# Patient Record
Sex: Female | Born: 1960 | Race: White | Hispanic: No | Marital: Single | Smoking: Current every day smoker
Health system: Southern US, Community
[De-identification: ages and names within clinical notes are randomized; demographics above are authoritative.]

## PROBLEM LIST (undated history)

## (undated) DIAGNOSIS — R51 Headache: Secondary | ICD-10-CM

## (undated) DIAGNOSIS — I10 Essential (primary) hypertension: Secondary | ICD-10-CM

## (undated) DIAGNOSIS — T8859XA Other complications of anesthesia, initial encounter: Secondary | ICD-10-CM

## (undated) DIAGNOSIS — G8929 Other chronic pain: Secondary | ICD-10-CM

## (undated) DIAGNOSIS — Z9889 Other specified postprocedural states: Secondary | ICD-10-CM

## (undated) DIAGNOSIS — F32A Depression, unspecified: Secondary | ICD-10-CM

## (undated) DIAGNOSIS — E785 Hyperlipidemia, unspecified: Secondary | ICD-10-CM

## (undated) DIAGNOSIS — IMO0002 Reserved for concepts with insufficient information to code with codable children: Secondary | ICD-10-CM

## (undated) DIAGNOSIS — R0600 Dyspnea, unspecified: Secondary | ICD-10-CM

## (undated) DIAGNOSIS — F419 Anxiety disorder, unspecified: Secondary | ICD-10-CM

## (undated) DIAGNOSIS — R519 Headache, unspecified: Secondary | ICD-10-CM

## (undated) HISTORY — DX: Headache: R51

## (undated) HISTORY — PX: TENDON RELEASE: SHX230

## (undated) HISTORY — PX: TONSILLECTOMY: SUR1361

## (undated) HISTORY — PX: OTHER SURGICAL HISTORY: SHX169

## (undated) HISTORY — DX: Other chronic pain: G89.29

## (undated) HISTORY — DX: Reserved for concepts with insufficient information to code with codable children: IMO0002

## (undated) HISTORY — DX: Headache, unspecified: R51.9

## (undated) HISTORY — PX: KNEE ARTHROSCOPY: SUR90

## (undated) HISTORY — DX: Hyperlipidemia, unspecified: E78.5

## (undated) HISTORY — PX: SHOULDER ARTHROTOMY: SHX1050

---

## 1998-12-21 ENCOUNTER — Ambulatory Visit (HOSPITAL_COMMUNITY): Admission: RE | Admit: 1998-12-21 | Discharge: 1998-12-21 | Payer: Self-pay | Admitting: Obstetrics & Gynecology

## 1998-12-21 ENCOUNTER — Encounter: Payer: Self-pay | Admitting: Obstetrics & Gynecology

## 1999-04-29 ENCOUNTER — Encounter: Payer: Self-pay | Admitting: Obstetrics & Gynecology

## 1999-04-29 ENCOUNTER — Inpatient Hospital Stay (HOSPITAL_COMMUNITY): Admission: AD | Admit: 1999-04-29 | Discharge: 1999-04-29 | Payer: Self-pay | Admitting: Obstetrics & Gynecology

## 1999-05-03 ENCOUNTER — Inpatient Hospital Stay (HOSPITAL_COMMUNITY): Admission: AD | Admit: 1999-05-03 | Discharge: 1999-05-06 | Payer: Self-pay | Admitting: Obstetrics & Gynecology

## 1999-05-05 ENCOUNTER — Encounter: Payer: Self-pay | Admitting: Obstetrics & Gynecology

## 2000-01-02 ENCOUNTER — Encounter (INDEPENDENT_AMBULATORY_CARE_PROVIDER_SITE_OTHER): Payer: Self-pay | Admitting: Specialist

## 2000-01-02 ENCOUNTER — Ambulatory Visit (HOSPITAL_BASED_OUTPATIENT_CLINIC_OR_DEPARTMENT_OTHER): Admission: RE | Admit: 2000-01-02 | Discharge: 2000-01-02 | Payer: Self-pay | Admitting: Orthopedic Surgery

## 2000-02-27 ENCOUNTER — Ambulatory Visit (HOSPITAL_COMMUNITY): Admission: RE | Admit: 2000-02-27 | Discharge: 2000-02-27 | Payer: Self-pay

## 2000-04-26 ENCOUNTER — Other Ambulatory Visit: Admission: RE | Admit: 2000-04-26 | Discharge: 2000-04-26 | Payer: Self-pay | Admitting: Obstetrics & Gynecology

## 2000-07-16 ENCOUNTER — Emergency Department (HOSPITAL_COMMUNITY): Admission: EM | Admit: 2000-07-16 | Discharge: 2000-07-17 | Payer: Self-pay | Admitting: Emergency Medicine

## 2000-10-22 ENCOUNTER — Inpatient Hospital Stay (HOSPITAL_COMMUNITY): Admission: AD | Admit: 2000-10-22 | Discharge: 2000-10-25 | Payer: Self-pay | Admitting: Obstetrics & Gynecology

## 2000-11-22 ENCOUNTER — Other Ambulatory Visit: Admission: RE | Admit: 2000-11-22 | Discharge: 2000-11-22 | Payer: Self-pay | Admitting: Obstetrics & Gynecology

## 2002-08-15 ENCOUNTER — Encounter: Payer: Self-pay | Admitting: Obstetrics & Gynecology

## 2002-08-15 ENCOUNTER — Ambulatory Visit (HOSPITAL_COMMUNITY): Admission: RE | Admit: 2002-08-15 | Discharge: 2002-08-15 | Payer: Self-pay | Admitting: Obstetrics & Gynecology

## 2003-02-22 ENCOUNTER — Encounter: Admission: RE | Admit: 2003-02-22 | Discharge: 2003-02-22 | Payer: Self-pay | Admitting: Neurosurgery

## 2003-03-11 ENCOUNTER — Encounter: Admission: RE | Admit: 2003-03-11 | Discharge: 2003-03-11 | Payer: Self-pay | Admitting: Neurosurgery

## 2003-11-18 ENCOUNTER — Other Ambulatory Visit (HOSPITAL_COMMUNITY): Admission: RE | Admit: 2003-11-18 | Discharge: 2004-02-16 | Payer: Self-pay | Admitting: Psychiatry

## 2003-11-18 ENCOUNTER — Inpatient Hospital Stay (HOSPITAL_COMMUNITY): Admission: RE | Admit: 2003-11-18 | Discharge: 2003-11-23 | Payer: Self-pay | Admitting: Psychiatry

## 2003-11-18 ENCOUNTER — Ambulatory Visit: Payer: Self-pay | Admitting: Psychiatry

## 2004-10-19 ENCOUNTER — Encounter: Admission: RE | Admit: 2004-10-19 | Discharge: 2005-01-17 | Payer: Self-pay

## 2004-11-27 ENCOUNTER — Inpatient Hospital Stay (HOSPITAL_COMMUNITY): Admission: EM | Admit: 2004-11-27 | Discharge: 2004-12-04 | Payer: Self-pay | Admitting: Emergency Medicine

## 2004-11-27 ENCOUNTER — Ambulatory Visit: Payer: Self-pay | Admitting: Infectious Diseases

## 2005-01-24 ENCOUNTER — Ambulatory Visit: Payer: Self-pay | Admitting: Internal Medicine

## 2006-01-17 ENCOUNTER — Inpatient Hospital Stay: Payer: Self-pay

## 2006-01-21 ENCOUNTER — Other Ambulatory Visit: Payer: Self-pay

## 2006-01-28 ENCOUNTER — Ambulatory Visit: Payer: Self-pay | Admitting: Unknown Physician Specialty

## 2006-07-24 ENCOUNTER — Encounter
Admission: RE | Admit: 2006-07-24 | Discharge: 2006-10-22 | Payer: Self-pay | Admitting: Physical Medicine and Rehabilitation

## 2006-07-24 ENCOUNTER — Ambulatory Visit: Payer: Self-pay | Admitting: Physical Medicine and Rehabilitation

## 2007-09-02 ENCOUNTER — Emergency Department (HOSPITAL_COMMUNITY): Admission: EM | Admit: 2007-09-02 | Discharge: 2007-09-03 | Payer: Self-pay | Admitting: Emergency Medicine

## 2008-11-21 ENCOUNTER — Emergency Department (HOSPITAL_COMMUNITY): Admission: EM | Admit: 2008-11-21 | Discharge: 2008-11-21 | Payer: Self-pay | Admitting: Emergency Medicine

## 2008-11-26 ENCOUNTER — Emergency Department (HOSPITAL_COMMUNITY): Admission: EM | Admit: 2008-11-26 | Discharge: 2008-11-26 | Payer: Self-pay | Admitting: Family Medicine

## 2009-01-03 ENCOUNTER — Encounter: Admission: RE | Admit: 2009-01-03 | Discharge: 2009-01-03 | Payer: Self-pay | Admitting: General Practice

## 2009-07-24 ENCOUNTER — Emergency Department (HOSPITAL_COMMUNITY): Admission: EM | Admit: 2009-07-24 | Discharge: 2009-07-24 | Payer: Self-pay | Admitting: Family Medicine

## 2009-07-24 ENCOUNTER — Emergency Department (HOSPITAL_COMMUNITY): Admission: EM | Admit: 2009-07-24 | Discharge: 2009-07-25 | Payer: Self-pay | Admitting: Emergency Medicine

## 2009-08-17 HISTORY — PX: CHOLECYSTECTOMY: SHX55

## 2009-09-12 ENCOUNTER — Inpatient Hospital Stay (HOSPITAL_COMMUNITY): Admission: EM | Admit: 2009-09-12 | Discharge: 2009-09-15 | Payer: Self-pay | Admitting: Emergency Medicine

## 2009-09-13 ENCOUNTER — Encounter (INDEPENDENT_AMBULATORY_CARE_PROVIDER_SITE_OTHER): Payer: Self-pay

## 2009-10-31 ENCOUNTER — Ambulatory Visit: Payer: Self-pay | Admitting: Vascular Surgery

## 2009-10-31 ENCOUNTER — Encounter (INDEPENDENT_AMBULATORY_CARE_PROVIDER_SITE_OTHER): Payer: Self-pay | Admitting: Emergency Medicine

## 2009-10-31 ENCOUNTER — Emergency Department (HOSPITAL_COMMUNITY): Admission: EM | Admit: 2009-10-31 | Discharge: 2009-10-31 | Payer: Self-pay | Admitting: Emergency Medicine

## 2009-11-14 ENCOUNTER — Ambulatory Visit: Payer: Self-pay | Admitting: Family Medicine

## 2009-11-14 DIAGNOSIS — L600 Ingrowing nail: Secondary | ICD-10-CM | POA: Insufficient documentation

## 2009-11-14 DIAGNOSIS — IMO0001 Reserved for inherently not codable concepts without codable children: Secondary | ICD-10-CM | POA: Insufficient documentation

## 2009-11-14 DIAGNOSIS — M25569 Pain in unspecified knee: Secondary | ICD-10-CM | POA: Insufficient documentation

## 2009-12-08 ENCOUNTER — Emergency Department (HOSPITAL_COMMUNITY): Admission: EM | Admit: 2009-12-08 | Discharge: 2009-12-08 | Payer: Self-pay | Admitting: Family Medicine

## 2010-03-28 ENCOUNTER — Emergency Department (HOSPITAL_COMMUNITY)
Admission: EM | Admit: 2010-03-28 | Discharge: 2010-03-28 | Payer: Self-pay | Source: Home / Self Care | Admitting: Emergency Medicine

## 2010-04-03 LAB — CBC
HCT: 39.4 % (ref 36.0–46.0)
Hemoglobin: 13.2 g/dL (ref 12.0–15.0)
MCH: 32.2 pg (ref 26.0–34.0)
MCHC: 33.5 g/dL (ref 30.0–36.0)
MCV: 96.1 fL (ref 78.0–100.0)
Platelets: 207 10*3/uL (ref 150–400)
RBC: 4.1 MIL/uL (ref 3.87–5.11)
RDW: 14.5 % (ref 11.5–15.5)
WBC: 8.9 10*3/uL (ref 4.0–10.5)

## 2010-04-03 LAB — COMPREHENSIVE METABOLIC PANEL
ALT: 34 U/L (ref 0–35)
AST: 55 U/L — ABNORMAL HIGH (ref 0–37)
Albumin: 3.6 g/dL (ref 3.5–5.2)
Alkaline Phosphatase: 71 U/L (ref 39–117)
BUN: 8 mg/dL (ref 6–23)
CO2: 25 mEq/L (ref 19–32)
Calcium: 8.9 mg/dL (ref 8.4–10.5)
Chloride: 106 mEq/L (ref 96–112)
Creatinine, Ser: 0.72 mg/dL (ref 0.4–1.2)
GFR calc Af Amer: >60 mL/min (ref >60)
GFR calc non Af Amer: >60 mL/min (ref >60)
Glucose, Bld: 90 mg/dL (ref 70–99)
Potassium: 3.5 mEq/L (ref 3.5–5.1)
Sodium: 141 mEq/L (ref 135–145)
Total Bilirubin: 0.4 mg/dL (ref 0.3–1.2)
Total Protein: 6.1 g/dL (ref 6.0–8.3)

## 2010-04-03 LAB — DIFFERENTIAL
Basophils Absolute: 0 10*3/uL (ref 0.0–0.1)
Basophils Relative: 0 % (ref 0–1)
Eosinophils Absolute: 0.1 10*3/uL (ref 0.0–0.7)
Eosinophils Relative: 1 % (ref 0–5)
Lymphocytes Relative: 20 % (ref 12–46)
Lymphs Abs: 1.8 10*3/uL (ref 0.7–4.0)
Monocytes Absolute: 0.9 10*3/uL (ref 0.1–1.0)
Monocytes Relative: 10 % (ref 3–12)
Neutro Abs: 6.2 10*3/uL (ref 1.7–7.7)
Neutrophils Relative %: 69 % (ref 43–77)

## 2010-04-03 LAB — LIPASE, BLOOD: Lipase: 27 U/L (ref 11–59)

## 2010-04-03 LAB — URINALYSIS, ROUTINE W REFLEX MICROSCOPIC
Hgb urine dipstick: NEGATIVE
Ketones, ur: NEGATIVE mg/dL
Nitrite: NEGATIVE
Protein, ur: NEGATIVE mg/dL
Specific Gravity, Urine: 1.008 (ref 1.005–1.030)
Urine Glucose, Fasting: NEGATIVE mg/dL
Urobilinogen, UA: 1 mg/dL (ref 0.0–1.0)
pH: 6.5 (ref 5.0–8.0)

## 2010-04-03 LAB — POCT PREGNANCY, URINE: Preg Test, Ur: NEGATIVE

## 2010-04-08 ENCOUNTER — Emergency Department (HOSPITAL_COMMUNITY)
Admission: EM | Admit: 2010-04-08 | Discharge: 2010-04-08 | Payer: Self-pay | Source: Home / Self Care | Admitting: Family Medicine

## 2010-04-18 NOTE — Assessment & Plan Note (Signed)
Summary: NP,KNEE PAIN,TOE ISSUES,NECK PAIN X 2 WKS   Vital Signs:  Patient profile:   50 year old female Height:      61 inches Weight:      130 pounds BMI:     24.65 BP sitting:   142 / 84  Vitals Entered By: Lillia Pauls CMA (November 14, 2009 1:58 PM)  History of Present Illness: 1) Right great toe pain--last few weeks. Previous hx of ingrown toe nail. No redness or d/c. Has had prior partial nail removal (without ablation).   2) B knee pain after running hills. Aching pain--lasts several hours. Also some pain with extended sitting. No prior knee injury or surgery. Reports she runs 5 miles in am and 3 miles in pm many days--trails mostly. She also bikes. No locking or giving way. No swelling redness or warmth.  3) Upper back / neck pain----many years. Has been on chronic narcotics in past, then went through outpt detox / rehab. 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 About a week ago she fell in her home--no lOC--has had increased occasional sharp pains radiating to her upppe back R>L since. Occasionally will have sharp pain in her  right arm. Occasional stinging or tingling--no true numbness or weakness--ptingling lasts a few seconds. No change iin dexterity--not dropping anything  PERTINENT PMH/PSH: Neck surgery as above cholecystectomy prior narcotics addiction--was in pain cliinic mgmt then -outpatient rehab. sees psychiatrist for depression and mood issues.  Current Medications (verified): 1)  Naprosyn 250 Mg Tabs (Naproxen) .Marland Kitchen.. 1 By Mouth Two Times A Day As Needed Joint Pain / Neck Pain 2)  Mirapex 0.5 Mg Tabs (Pramipexole Dihydrochloride) .... Per Psych 3)  Alprazolam 1 Mg Tabs (Alprazolam) .... Per Psych 4)  Depakote 500 Mg Tbec (Divalproex Sodium) .... Per Psych 5)  Flurazepam Hcl 30 Mg Caps (Flurazepam Hcl) .... Per Psych 6)  Antihistamine .... Per Psych  Allergies (verified): 1)  Sulfa  Review of Systems  The patient denies  fever.         Please see HPI for additional ROS.   Physical Exam  General:  alert, well-developed, well-nourished, and well-hydrated.   Neck:  supple.  FROM in flexion--lacks a little in extension--no pain. Normal lateral rotation. neg ative spurlings test B. Msk:  TTP B trapezius--almost like a trigger point but a larger area--4-5 cm on each side. Top of trap--no muscle spasm or deformity felt.  SHOULDEERS FROM in all planes and intact RC strength. RIGHT GREAT TOE: lateral corner of nail ingrowing--no sign of infection. no erythema or d.c.  sensation intact   Knee Exam  Knee Exam:    Right:    Inspection:  Normal    Palpation:  Normal    Stability:  stable    Tenderness:  no    Swelling:  no    Erythema:  no    Left:    Inspection:  Normal    Palpation:  Normal    Stability:  stable    Tenderness:  no    Swelling:  no    Erythema:  no    Full flexion and exctension B.  NO effusion. Small amount crepitus right. No joint line tenderness. calves B soft. Popliteal space benign   Detailed Back/Spine Exam  Cervical Exam:  Inspection-deformity:    Normal Palpation-spinal tenderness:  Normal  Thoracic Exam:  Inspection-deformity:    Normal Palpation-spinal tenderness:  Normal   Impression & Recommendations:  Problem #  1:  UNSPECIFIED MYALGIA AND MYOSITIS (ICD-729.1)  Her updated medication list for this problem includes:    Naprosyn 250 Mg Tabs (Naproxen) .Marland Kitchen... 1 by mouth two times a day as needed joint pain / neck pain she wanted flexeril but with her history of addiction issues I think NSAID would be better option  Problem # 2:  INGROWN TOENAIL (ICD-703.0) I recommended she get a PCP for these issues or see podiatry  Problem # 3:  PATELLO-FEMORAL SYNDROME (ICD-719.46) NSAIDS, HEP for knee rehab, activity modification  Complete Medication List: 1)  Naprosyn 250 Mg Tabs (Naproxen) .Marland Kitchen.. 1 by mouth two times a day as needed joint pain / neck pain 2)  Mirapex  0.5 Mg Tabs (Pramipexole dihydrochloride) .... Per psych 3)  Alprazolam 1 Mg Tabs (Alprazolam) .... Per psych 4)  Depakote 500 Mg Tbec (Divalproex sodium) .... Per psych 5)  Flurazepam Hcl 30 Mg Caps (Flurazepam hcl) .... Per psych 6)  Antihistamine  .... Per psych Prescriptions: NAPROSYN 250 MG TABS (NAPROXEN) 1 by mouth two times a day as needed joint pain / neck pain  #60 x 0   Entered and Authorized by:   Denny Levy MD   Signed by:   Denny Levy MD on 11/14/2009   Method used:   Print then Give to Patient   RxID:   [id number]

## 2010-06-02 LAB — DIFFERENTIAL
Basophils Absolute: 0 10*3/uL (ref 0.0–0.1)
Basophils Relative: 0 % (ref 0–1)
Eosinophils Absolute: 0.2 10*3/uL (ref 0.0–0.7)
Eosinophils Relative: 4 % (ref 0–5)
Lymphocytes Relative: 38 % (ref 12–46)
Lymphs Abs: 1.9 10*3/uL (ref 0.7–4.0)
Monocytes Absolute: 0.4 10*3/uL (ref 0.1–1.0)
Monocytes Relative: 7 % (ref 3–12)
Neutro Abs: 2.5 10*3/uL (ref 1.7–7.7)
Neutrophils Relative %: 51 % (ref 43–77)

## 2010-06-02 LAB — URINALYSIS, ROUTINE W REFLEX MICROSCOPIC
Glucose, UA: NEGATIVE mg/dL
Hgb urine dipstick: NEGATIVE
Ketones, ur: 15 mg/dL — AB
Nitrite: NEGATIVE
Protein, ur: NEGATIVE mg/dL
Specific Gravity, Urine: 1.03 (ref 1.005–1.030)
Urobilinogen, UA: 2 mg/dL — ABNORMAL HIGH (ref 0.0–1.0)
pH: 6 (ref 5.0–8.0)

## 2010-06-02 LAB — CBC
HCT: 41.2 % (ref 36.0–46.0)
Hemoglobin: 14.3 g/dL (ref 12.0–15.0)
MCH: 30.8 pg (ref 26.0–34.0)
MCHC: 34.7 g/dL (ref 30.0–36.0)
MCV: 88.8 fL (ref 78.0–100.0)
Platelets: 221 10*3/uL (ref 150–400)
RBC: 4.64 MIL/uL (ref 3.87–5.11)
RDW: 12.7 % (ref 11.5–15.5)
WBC: 4.9 10*3/uL (ref 4.0–10.5)

## 2010-06-02 LAB — COMPREHENSIVE METABOLIC PANEL
ALT: 13 U/L (ref 0–35)
AST: 18 U/L (ref 0–37)
Albumin: 3.7 g/dL (ref 3.5–5.2)
Alkaline Phosphatase: 58 U/L (ref 39–117)
BUN: 14 mg/dL (ref 6–23)
CO2: 22 mEq/L (ref 19–32)
Calcium: 9.1 mg/dL (ref 8.4–10.5)
Chloride: 108 mEq/L (ref 96–112)
Creatinine, Ser: 0.79 mg/dL (ref 0.4–1.2)
GFR calc Af Amer: >60 mL/min (ref >60)
GFR calc non Af Amer: >60 mL/min (ref >60)
Glucose, Bld: 88 mg/dL (ref 70–99)
Potassium: 3.4 mEq/L — ABNORMAL LOW (ref 3.5–5.1)
Sodium: 142 mEq/L (ref 135–145)
Total Bilirubin: 0.5 mg/dL (ref 0.3–1.2)
Total Protein: 6.3 g/dL (ref 6.0–8.3)

## 2010-06-02 LAB — LIPASE, BLOOD: Lipase: 27 U/L (ref 11–59)

## 2010-06-04 LAB — URINALYSIS, ROUTINE W REFLEX MICROSCOPIC
Protein, ur: NEGATIVE mg/dL
Specific Gravity, Urine: 1.025 (ref 1.005–1.030)
Urobilinogen, UA: 0.2 mg/dL (ref 0.0–1.0)

## 2010-06-04 LAB — COMPREHENSIVE METABOLIC PANEL
AST: 70 U/L — ABNORMAL HIGH (ref 0–37)
Albumin: 3 g/dL — ABNORMAL LOW (ref 3.5–5.2)
Albumin: 3.5 g/dL (ref 3.5–5.2)
Alkaline Phosphatase: 50 U/L (ref 39–117)
BUN: 13 mg/dL (ref 6–23)
CO2: 24 mEq/L (ref 19–32)
Calcium: 8 mg/dL — ABNORMAL LOW (ref 8.4–10.5)
Chloride: 104 mEq/L (ref 96–112)
Chloride: 104 mEq/L (ref 96–112)
Creatinine, Ser: 0.67 mg/dL (ref 0.4–1.2)
GFR calc Af Amer: >60 mL/min (ref >60)
GFR calc non Af Amer: >60 mL/min (ref >60)
Glucose, Bld: 84 mg/dL (ref 70–99)
Potassium: 4 mEq/L (ref 3.5–5.1)
Total Bilirubin: 0.6 mg/dL (ref 0.3–1.2)

## 2010-06-04 LAB — BASIC METABOLIC PANEL
BUN: <1 mg/dL — ABNORMAL LOW (ref 6–23)
Chloride: 105 mEq/L (ref 96–112)
Glucose, Bld: 109 mg/dL — ABNORMAL HIGH (ref 70–99)
Potassium: 3.5 mEq/L (ref 3.5–5.1)

## 2010-06-04 LAB — CBC
HCT: 42.5 % (ref 36.0–46.0)
Hemoglobin: 14.5 g/dL (ref 12.0–15.0)
MCH: 32.6 pg (ref 26.0–34.0)
MCH: 32.8 pg (ref 26.0–34.0)
MCHC: 34.7 g/dL (ref 30.0–36.0)
MCV: 95.7 fL (ref 78.0–100.0)
Platelets: 185 10*3/uL (ref 150–400)
Platelets: 198 10*3/uL (ref 150–400)
RBC: 4.03 MIL/uL (ref 3.87–5.11)
RBC: 4.44 MIL/uL (ref 3.87–5.11)
WBC: 8.1 10*3/uL (ref 4.0–10.5)

## 2010-06-04 LAB — DIFFERENTIAL
Basophils Absolute: 0 10*3/uL (ref 0.0–0.1)
Basophils Relative: 0 % (ref 0–1)
Monocytes Absolute: 0.5 10*3/uL (ref 0.1–1.0)
Neutro Abs: 5.3 10*3/uL (ref 1.7–7.7)

## 2010-06-04 LAB — RAPID URINE DRUG SCREEN, HOSP PERFORMED
Barbiturates: NOT DETECTED
Opiates: NOT DETECTED

## 2010-06-04 LAB — LIPASE, BLOOD: Lipase: 28 U/L (ref 11–59)

## 2010-06-06 LAB — COMPREHENSIVE METABOLIC PANEL
AST: 38 U/L — ABNORMAL HIGH (ref 0–37)
Albumin: 3.6 g/dL (ref 3.5–5.2)
Chloride: 102 mEq/L (ref 96–112)
Creatinine, Ser: 0.52 mg/dL (ref 0.4–1.2)
GFR calc Af Amer: >60 mL/min (ref >60)
Potassium: 3.4 mEq/L — ABNORMAL LOW (ref 3.5–5.1)
Total Bilirubin: 0.5 mg/dL (ref 0.3–1.2)

## 2010-06-06 LAB — CBC
Platelets: 172 10*3/uL (ref 150–400)
WBC: 7.8 10*3/uL (ref 4.0–10.5)

## 2010-06-06 LAB — RAPID URINE DRUG SCREEN, HOSP PERFORMED
Amphetamines: NOT DETECTED
Benzodiazepines: POSITIVE — AB

## 2010-06-06 LAB — DIFFERENTIAL
Basophils Absolute: 0 10*3/uL (ref 0.0–0.1)
Eosinophils Relative: 1 % (ref 0–5)
Lymphocytes Relative: 17 % (ref 12–46)
Monocytes Absolute: 0.4 10*3/uL (ref 0.1–1.0)

## 2010-06-06 LAB — ETHANOL: Alcohol, Ethyl (B): 76 mg/dL — ABNORMAL HIGH (ref 0–10)

## 2010-06-22 ENCOUNTER — Other Ambulatory Visit: Payer: Self-pay | Admitting: Obstetrics & Gynecology

## 2010-06-22 DIAGNOSIS — N6322 Unspecified lump in the left breast, upper inner quadrant: Secondary | ICD-10-CM

## 2010-06-23 ENCOUNTER — Ambulatory Visit
Admission: RE | Admit: 2010-06-23 | Discharge: 2010-06-23 | Disposition: A | Discharge status: Home / Self Care | Payer: Medicare Other | Source: Ambulatory Visit | Attending: Obstetrics & Gynecology | Admitting: Obstetrics & Gynecology

## 2010-06-23 ENCOUNTER — Other Ambulatory Visit: Payer: Self-pay | Admitting: Obstetrics & Gynecology

## 2010-06-23 DIAGNOSIS — N6322 Unspecified lump in the left breast, upper inner quadrant: Secondary | ICD-10-CM

## 2010-08-04 NOTE — Discharge Summary (Signed)
[facility]  PatientTAYTUM, SCHECK Visit Number: [id number] MRN: [id number]          Service Type: OBS Location: 9300 [id number] Attending Physician:  Genia Del Dictated by:   Genia Del, M.D. Admit Date:  10/22/2000 Discharge Date: 10/25/2000                             Discharge Summary  DATE OF BIRTH:                May 02, 1960.  ADMISSION DIAGNOSES:          1. Thirty-eight plus weeks.                               2. Two previous cesarean sections.                               3. Chronic neck pain.  DISCHARGE DIAGNOSES:          1. Thirty-eight plus weeks.                               2. Two previous cesarean sections.                               3. Chronic neck pain.                               4. Birth of a baby girl by cesarean section                                  on October 22, 2000.  HOSPITAL COURSE:              The patient was admitted on October 22, 2000 and had a repeat low transverse cesarean section on the same day. Birth was at 8:15 a.m. of a baby girl, Apgars 9 and 9. Estimated blood loss was 500 cc. No complication occurred during the surgery.  The postoperative evolution was unremarkable. The patient remained stable and afebrile. Her hemoglobin on postoperative day #1 was 10.9, hematocrit 31.6. The patient was discharged on postoperative day #3 in a good stable status. She was given postoperative advice.  DISCHARGE MEDICATIONS:        Darvocet-N 100, 30 tablets, were prescribed. She was advised to continue her Prenate vitamins and have a high iron diet.  DISCHARGE FOLLOWUP:           The patient will follow up at the office for a postpartum appointment in four weeks. Dictated by:   Genia Del, M.D. Attending Physician:  Genia Del DD:  11/20/00 TD:  11/20/00 Job: [id number]

## 2010-08-04 NOTE — H&P (Signed)
NAME:  [name], [name]                 ACCOUNT NO.:  [id number]   MEDICAL RECORD NO.:  [id number]          PATIENT TYPE:  EMS   LOCATION:  MINO                         FACILITY:  [facility]   PHYSICIAN:  Mobolaji B. Bakare, M.D.DATE OF BIRTH:  Aug 07, 1960   DATE OF ADMISSION:  11/27/2004  DATE OF DISCHARGE:                                HISTORY & PHYSICAL   PRIMARY CARE PHYSICIAN:  At [facility].   PSYCHIATRIST:  Dr. [name].   CHIEF COMPLAINT:  Dizziness and blacking out for the past six days.   HISTORY OF PRESENTING COMPLAINT:  [name] [name] is a 50 year old Caucasian female  with history of major depression and chronic pain involving cervical  degenerative disk disease.  She was seen by psychiatric nurse practitioner a  week ago.  At that visit she complained of insomnia and she was prescribed  100 mg q.h.s.  Subsequently the patient developed dizziness on standing up  from a sitting position and she has been feeling extremely weak, unable to  get out of bed and she was brought to the emergency department for  evaluation today.  She is grossly orthostatic with a blood pressure of 71/58  on laying and 57/42 on standing.  She was given 2 liters of IV normal  saline.  Blood pressure has improved to 98/60.   [name] [name] has a history of major depression and she is still having symptoms  including lack of concentration, loss of interest in activities.  She has  low energy, poor appetite.  She last saw her psychiatrist about six months  ago.  She has been seen by the nurse practitioner in the office Misty Stanley Pous).   REVIEW OF SYSTEMS:  She denies any abdominal pain, dysuria, urgency or  increased frequency of micturition.  She does have cough productive of  sputum.  No chest pain, no headaches.  Her cervical pain has improved to  3/10 which is manageable for her with the chronic pain medications.   PAST MEDICAL HISTORY:  1.  Major depression.  2.  Chronic cervical pain/cervical  degenerative disk disease.  3.  History of pyelonephritis.   PAST SURGICAL HISTORY:  1.  Cervical disk surgery in 1999.  2.  C-section.   CURRENT MEDICATIONS:  1.  Trazodone 100 mg q.h.s.  2.  Vicodin 7.5/325 p.r.n.  3.  Klonopin 0.5 mg b.i.d.  4.  Cozaar 20 mg q. day.  5.  Ambien 25 mg q.h.s.  6.  Kadian 30 mg b.i.d.   ALLERGIES:  1.  Celebrex.  2.  Sulfa.   SOCIAL HISTORY:  The patient is a well educated lady with masters in  business administration and masters in taxation.  She worked as a IT trainer for  fifteen years.  She has been out of work since August, 2005 secondary to  major depression.  She has three children, two grown, one is 49 and 95-years-  old.  She has got a third child, a girl, 46-years-old.  She lives with her  husband.  She smokes half to one pack per day of cigarettes.  She  denies  alcohol use and drug abuse.   FAMILY HISTORY:  Mother has rheumatoid arthritis.  Father has heart disease.  There is no family history of stroke, diabetes or cancer.   PHYSICAL EXAMINATION:  VITAL SIGNS:  Initial vitals:  Temperature was 97.1,  blood pressure 71/38, laying with a heart rate of 59, 65/46, sitting with a  heart rate of 62, 57/42 with a heart rate of 76.  Blood pressure improved to  98/60 after 2 liters of IV fluids.  GENERAL:  On examination the patient's affect is flat.  She sometimes doses  off during conversation.  She is not in respiratory distress.  HEENT:  Normocephalic, atraumatic.  Pupils equal, round, reactive to light.  Extraocular muscles intact.  No carotid bruit, no elevated JVD, no oral  thrush.  Mucous membranes moist.  LUNGS:  Clear clinically to auscultation.  CV:  S1, S2 regular, no murmur, no gallop, no rub.  ABDOMEN:  Not distended, soft, mild right lumbar area tenderness without  rebound or guarding, bowel sounds present, no palpable organomegaly.  EXTREMITIES:  No pedal edema, no calf tenderness, dorsalis pedis pulses 2+  bilaterally.  CNS:   No focal neurological deficits.  SKIN:  No rash, no petechiae.   INITIAL LABORATORY DATA:  White cells 7.6, hemoglobin 14.0, hematocrit 41.5,  MCV 89.8, platelets 190, neutrophils 57, lymphocytes 34%, sodium 139,  potassium 3.7, chloride 105, BUN 12, creatinine 0.6.  Urinalysis is clear  and specific gravity of 1.029, small bilirubin, negative for nitrate,  leukocyte esterase and protein.  EKG shows sinus bradycardia with a heart  rate of 58 and nonspecific ST changes in V3 with an inverted T, flat ST  segment in V4.  She has normal intervals.  Glucose is 65 (low).   ASSESSMENT AND PLAN:  1.  Hypotension.  This is probably secondary to medications with the recent      introduction of Trazodone on a background of multiple narcotics and      analgesics.  Will hold on Trazodone for now, reduce Kadian to 20 mg      b.i.d. as tolerated with her pain, continue Vicodin at 7.5/325 q. 4      hours p.r.n., continue IV fluids normal saline at 150 cc per hour.  She      may need multiple boluses to keep systolic blood pressure greater than      90.  Will reduce Ambien to 10 mg q.h.s.  I doubt this hypotension is      related to sepsis.  Will check blood cultures, cardiac enzymes, repeat      orthostatic blood pressure q. day.  2.  Major depression.  The patient is currently on Prozac.  She still has      symptoms of anhedonia.  Will ask Dr. [name] to see regarding      maximizing treatment.  She may benefit from Cymbalta in view of her      ongoing pain syndrome.  3.  Chronic pain/cervical disk disease.  Will continue pain medications as      outlined above.  4.  Hypoglycemia.  I think this is related to her poor p.o. intake and      secondary to the extreme weakness and      severe depression.  Adrenal insufficiency is a consideration in the      setting of hypotension.  Will check random cortisol, monitor CBG q. 2     hours until stable for consecutive times.  5.  Tobacco use.  Will obtain  tobacco cessation counseling.      Mobolaji B. Corky Downs, M.D.  Electronically Signed     MBB/MEDQ  D:  11/27/2004  T:  11/27/2004  Job:  [id number]   cc:   Daine Floras, M.D.  Fax: [phone]   [facility]

## 2010-08-04 NOTE — Discharge Summary (Signed)
NAMEVIRLEE, [name] [name]                 ACCOUNT NO.:  [id number]   MEDICAL RECORD NO.:  [id number]          PATIENT TYPE:  IPS   LOCATION:  0504                          FACILITY:  [facility]   PHYSICIAN:  Syed T. Arfeen, M.D.   DATE OF BIRTH:  02-23-1961   DATE OF ADMISSION:  11/18/2003  DATE OF DISCHARGE:  11/23/2003                                 DISCHARGE SUMMARY   IDENTIFYING DATA:  The patient is a 50 year old married white female who  came as a voluntary admission.  History of the presenting illness:  The  patient has a history of depression, has been worsening of her symptoms  since January of this year when her pain doctor closed his practice.  The  patient then located a new physician who took her off OxyContin and since  then she has been struggling to adjust to a new medication.  The patient  reported increased depression, with anhedonia and neurovegetative symptoms.  She lost her daily activities and is unable to get up to eat or drink.  She  reported some weight loss, however unable to describe the quantity.  The  patient has a history of degenerative disk disease and had a surgery of C7  with metal plate and screws inserted in 1999.  She is now on pain medicine  but feels that these medicines are not helping her.  She is also on  antidepressant for her depressed mood but had not even gotten it refilled  and finding herself unable to get out of bed to keep her appointment.  The  patient has a 21-year-old child at home and is finding it difficult to do her  daily activities and to attend properly to her 35-year-old.  The patient has  suicidal thoughts but feels that she is not even able to organize how she  might want to kill herself.  She denied any homicidal thoughts, auditory  hallucinations or visual hallucinations.  She denies any panic attacks or  mood fluctuation.   PAST PSYCHIATRIC HISTORY:  This is the patient's first admission to [facility].  The patient does have  a history of postpartum depression  after the birth of her last child and was treated with Risperdal and Prozac.  The patient did report doing quite well on these medications.  The patient  has been hospitalized at least once in [place] complaints of [place]orexia,  but that was 15 years ago.  More recently, the patient was at one point on  Paxil and Zoloft in the past for depression and does not remember how well  she responded because it was too long ago for her to remember, although she  reported that she had been doing well on the Prozac.   PAST MEDICAL HISTORY:  Remarkable to surgery in 1999 for her neck, C7,  history of poly nephritis.   PHYSICAL EXAMINATION:  Well-nourished, well-developed female who appears to  be in no distress.  She is somewhat disheveled.  Gait is within normal  limits.  Neurological roughly intact.  No focal findings observed in  physical  examination.  For details please see admission note.   ALLERGIES:  CORTISONE, SULFA.   LABORATORY DATA:  Normal CBC and WBC.  Hemoglobin 11.6, Hematocrit 34.2.  Basic metabolic panel reveals normal electrolytes.  Blood glucose was  slightly elevated on this fasting.  Liver enzymes were normal.  Kidney  function normal.   MENTAL STATUS EXAM:  This is a fully alert female with somewhat slowed motor  response, displays psychomotor slowing.  Affect blunt.  Somewhat withdrawn  but warmed quite a bit with conversation, becoming more conversant.  Speech  normal tone but slightly sluggish with slowed response. Mood depressed  though pleasant, helpless.  Thought processes positive for some thought  blocking, positive for suicidal ideation though no clear plan.  Denies any  homicidal thoughts, cognition intact, alert and oriented x3.  Impulse  control and judgment somewhat impaired.   ADMISSION DIAGNOSES:   AXIS I:  Major depression, recurrent, severe.   AXIS II:  Deferred.   AXIS III:  Chronic neck pain, degenerative disk  disease.   AXIS IV:  Severe.  Impairment activities of daily living due to depressive  symptoms and pain.   AXIS V:  20.   HOSPITAL COURSE:  The patient was admitted and ordered routine p.r.n.  medications, underwent further monitoring.  The patient was restarted on her  pain medication, Kadian and Vicodin.  She was started on Ambien q.h.s.  p.r.n. for sleep.  She was started on Prozac 20 mg q.a.m. and Risperdal 0.25  q.h.s.  She tolerated medication very well.  The dose was titrated according  to therapeutic response.  She was also encouraged to participate in  individual, group and milieu therapy.  She was placed on safety check.  The  patient started showing some improvement.  Dose was increased to Prozac 30  mg and Risperdal was increased to 0.5 mg a.m. and h.s.  Family session was  scheduled with husband which went well.  Husband appeared supportive.  The  patient was able to verbalize her distress, depression and needs.  Husband  reported that the patient was back to baseline.  The patient also felt that  she was functional, active and social.  She was also seen in the group  meetings very verbal and interactive and organized.  Discharge planning was  discussed with the patient and the patient appeared to be fully  acknowledging.  She reported her mood has been improved, stable, with  increased coping skills.  She reported no depressive or suicidal thoughts.   CONDITION AT DISCHARGE:  Markedly improved,  mood euthymic, affect bright,  thought process goal directed, thought content negative for any dangerous  ideation or psychotic symptoms.  Appears motivated with aftercare plan and  excited to return and go back to her husband.   DISPOSITION:  The patient was discharged with a follow-up at [facility] with Lamarr Lulas for September 8, 11:30, phone [phone].   DISCHARGE DIAGNOSES:  AXIS I:  Major depression with psychotic features.   AXIS II:  Deferred.   AXIS III:   Chronic neck pain.   AXIS IV:  Severe.  Impairment in activities of daily living due to  depressive symptoms.   AXIS V:  70[name]  STA/MEDQ  D:  12/17/2003  T:  12/18/2003  Job:  [id number]

## 2010-08-04 NOTE — Op Note (Signed)
[facility]  Patient:    [name] [name], [name] [name]                        MRN: [id number] Proc. Date: 10/22/00 Adm. Date:  [id number] Attending:  Genia Del                           Operative Report  DATE OF BIRTH:                December 21, 1960  PREOPERATIVE DIAGNOSES:       1. Intrauterine pregnancy at 38+ weeks.                               2. Two previous cesarean sections.                               3. Chronic neck pain.  POSTOPERATIVE DIAGNOSES:      1. Intrauterine pregnancy at 38+ weeks.                               2. Two previous cesarean sections.                               3. Chronic neck pain.  INTERVENTION:                 Repeat elective low transverse cesarean section.  SURGEON:                      Genia Del, M.D.  ASSISTANT:                    Hayes Ludwig  ANESTHESIOLOGIST:             Ellison Hughs., M.D.  DESCRIPTION OF PROCEDURE:     Under spinal anesthesia with the patient in the 15-degree left decubitus position, she was prepped with Betadine and a bladder catheter inserted.  The patient was draped as usual.  A Pfannenstiel incision was made with a scalpel at the site of the previous scar.  We then opened the aponeurosis transversely with Mayo scissors.  The rectus muscles were detached from the aponeurosis in the midline.  The parietal peritoneum was opened longitudinally with Metzenbaum scissors.  We then opened the visceral peritoneum at the level of the lower uterine segment transversely with Metzenbaum scissors.  We reclined the bladder downward.  A low transverse hysterotomy was done with a scalpel.  The incision was prolonged on each side with scissors.  The fetus was in the cephalic presentation.  Amniotic fluid was clear.  Birth of the baby girl was at 8:15 a.m.  The baby was suctioned after delivery of the head with the vacuum.  Then, the cord was clamped and cut.  The baby was given to  the neonatal team.  Apgars were 9 and 9.  Manual extraction of the placenta, which appeared complete.  Pitocin was started in the IV fluids.  The uterus was contracting well.  Revision of the uterus was done.  We then closed the hysterotomy with total locked running suture with 0 Vicryl.  A second plane was done with a mattress  suture with 0 Vicryl.  Two X-stitches were applied to complete hemostasis on the last mid aspect of the incision.  Hemostasis was then adequate.  The two ovaries and tubes appeared normal, as well as the uterus.  We then irrigated and suctioned the abdominal and pelvic cavity.  We verified hemostasis in the rectus muscles and aponeurosis and completed with electrocautery.  We then closed the aponeurosis with two half running sutures of 0 Vicryl.  We removed old suture material from previous dissection.  We infiltrated the subcutaneous tissue with 0.25% Marcaine plain, 20 cc and completed hemostasis with electrocautery in the adipose tissue.  We then reapproximated the skin with staples.  A dry dressing was applied.  Lap and instrument counts were complete x 2.  Estimated blood loss was 500 cc.  No complications occurred and the patient was transferred to the recovery room in good status.  Her blood group was A positive.  Rubella immune. DD:  10/22/00 TD:  10/22/00 Job: [id number]

## 2010-08-04 NOTE — Discharge Summary (Signed)
[facility]  Patient:    [name] [name], [name] [name]                        MRN: [id number] Adm. Date:  [id number] Disc. Date: [id number] Attending:  Genia Del                           Discharge Summary  DATE OF BIRTH:                Dec 11, 1960  ADMISSION DIAGNOSES:          1. Left acute pyelonephritis.                               2. Depression.                               3. 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  DISCHARGE DIAGNOSES:          1. Left acute pyelonephritis.                               2. Depression.                               3. 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  HOSPITAL COURSE:              Patient responded to IV antibiotic therapy with ampicillin and gentamicin.  Renal ultrasound was done and came back normal.  No  evidence of hydronephrosis, no calculi.  DISPOSITION:                  Discharged on May 06, 1999.  MEDICATIONS:                  Patient was prescribed Macrobid for a total of 10 days.  INSTRUCTIONS:                 Advised to repeat urine culture after the end of he antibiotic therapy.  The patient was given the advices and the results of her renal ultrasound by phone because she discharged herself before medical visit and signature of the chart. DD:  06/07/99 TD:  06/08/99 Job: [id number]

## 2010-08-04 NOTE — Op Note (Signed)
[facility]  Patient:    [name] [name], [name] [name]                        MRN: [id number] Proc. Date: 01/02/00 Adm. Date:  [id number] Attending:  Ronne Binning                           Operative Report  PREOPERATIVE DIAGNOSIS:  Tenosynovitis cyst, left thumb.  POSTOPERATIVE DIAGNOSIS:  Tenosynovitis cyst, left thumb.  OPERATION:  Excision of cyst.  Flexure sheath release A1 pulley, left thumb.  SURGEON:  Nicki Reaper, M.D.  ASSISTANTBerneda Rose  ANESTHESIA:  Forearm base IV regional.  ANESTHESIOLOGIST:  Burna Forts, M.D.  HISTORY:  The patient is a 50 year old female with a history of trigger in her upper thumb, a mass present on the volar aspect which has not responded to conservative treatment.  PROCEDURE: The patient was brought to the operating room where a forearm base IV region anesthetic was carried out without difficulty.  She was prepped and draped using Betadine scrubbing solution with the left arm free.  A transverse incision was made over the A1 pulley carried down through the subcutaneous tissue where these were electrocauterized where radioulnar digital artery and nerve were identified and protected.  The cyst was immediately encountered.  A second cyst was also encountered.  With blunt sharp dissection this was dissected free.  The A1 pulley was excised, taking care to protect the oblique pulley.  The thumb place for full range of motion for the triggering was identified.  The wound was irrigated.  The skin was closed with an open 5-0 nylon suture.  Sterile dressings were applied.  The patient tolerated the procedure well and was taken to the recovery room for observation in satisfactory condition.  She is discharged home and is to return to the hand center in [place] in one week.  She is on Vicodin and Keflex. DD:  01/02/00 TD:  01/02/00 Job: [id number]

## 2010-08-04 NOTE — Group Therapy Note (Signed)
[name] [name] is a married 50 year old white female who has 3 children with  a 19 and a 15 year-old still living at home.   She is referred by Dr. Cyndia Diver and [name].  She has an  approximately 10 year history of neck and right arm pain.  Predominant  problem is cervicalgia.  She is status post an ACDF at C8 T1 back in  1998.   She has been managed for quite awhile at [facility].  She  states that her primary caregiver from that clinic is retiring and she  was sent to our clinic then.   She has been stable for quite awhile now on Kadian 30 mg twice a day and  hydrocodone 7.5/750 2-4 times a day.   She also sees Dr. Tiajuana Amass who sees her for depression and  anxiety with an apparent history of bipolar disease.   Her pain is in the cervical region through the right scapular region,  about a 7 on a scale of 10, interfering quite a bit with general  activity.  Her sleep tends to be poor.  Pain is worse with standing and  sitting, improves with rest, heat, medication.  She is getting fairly  good relief with the current meds that she is prescribed from [facility].   She used to work as a IT trainer.  She is no longer employed.  She states she  is disabled.   She is independent with her self care, needs some assistance with high  level household duties and shopping.  Denies problems controlling bowel  or bladder.  Denies suicidal ideation.   REVIEW OF SYSTEMS:  Positive for fevers, chills, weight gain, night  sweats, nausea, vomiting, constipation, poor appetite respiratory  infections and wheezing.   She is followed by Dr. Josiah Lobo as well as Dr. Tiajuana Amass.   Denies any problems with diabetes, ulcers, cancers, kidney problems,  thyroid problems, heart problems or high blood pressure or liver  problems.   PAST SURGICAL HISTORY:  Positive for:  1. The ACDF C8 T1 1998.  2. Three cesarean sections.   MEDICAL HISTORY:  Is significant for  history of pituitary adenoma.  She  is followed yearly at pituitary center in [place].   She is married and smokes a pack of cigarettes a day.  She denies legal  substance use.   FAMILY HISTORY:  Positive for high blood pressure and heart disease.   She cannot remember when her last imaging study of her cervical spine  was.   EXAM:  Today, her blood pressure was 120/74, pulse 94, respirations 16,  99% saturated on room air.  She is a thin adult female who appears her  stated age.  She does not appear in any distress.  She does appear  initially somewhat depressed.  Her affect does brighten as I interview  her, however.  She is oriented x3, her speech is clear.  She follows  commands without any problems.  No lability, no emotional lability was  observed with her during my interview; however, nursing staff reported  that she was somewhat tearful on initial intake.   She is able to stand independently after being seated, no pain behaviors  were appreciated.  Her gait is stable and nonantalgic in the room.  Romberg's test, tandem gait are performed adequately.  Reflexes are  symmetric and intact in the upper and lower extremities, no asymmetry  noted.  Motor strength is good in both  upper and lower extremities, no  sensory deficits are appreciated.  She does have some limitations,  especially with rotation to the right compared to the left, 30 degrees  vs. 45 degrees.  She has full shoulder range of motion with abduction  and forward flexion.   IMPRESSION:  1. Cervicalgia.  2. Status post anterior cervical decompression and fusion at C8 T1      1998.   PLAN:  Obtain urine drug screen.  Anticipate will be filling her Kadian  and hydrocodone for her after urine drug screen is cleared.  She is  currently on Kadian 30 mg twice a day and hydrocodone 7.5/750 2-4 times  per day.  Will also order flexion extension x-rays of her cervical spine  which I will review at our next visit.  She  has been disabled since August 17, 2005.           ______________________________  Brantley Stage, M.D.     DMK/MedQ  D:  07/25/2006 13:52:05  T:  07/25/2006 15:49:21  Job #:  [id number]   cc:   Josiah Lobo  Fax: [phone]

## 2010-08-04 NOTE — Consult Note (Signed)
[name] [name], [name] [name]                 ACCOUNT NO.:  [id number]   MEDICAL RECORD NO.:  [id number]          PATIENT TYPE:  INP   LOCATION:  2908                         FACILITY:  [facility]   PHYSICIAN:  Santina Evans A. [name], M.D.DATE OF BIRTH:  01-Feb-1961   DATE OF CONSULTATION:  11/28/2004  DATE OF DISCHARGE:                                   CONSULTATION   REASON FOR CONSULTATION:  Address Lyme disease.   HISTORY OF PRESENT ILLNESS:  [name] [name] is a 50 year old with a several year  history of fatigue, cognitive dysfunction, myalgias, depression diagnosed by  Dr. Abner Greenspan as Lyme disease. His opinion sought by the patient and parents  based on similar symptoms in a friend who was also a patient of Dr. Abner Greenspan.  She has received numerous different types of antibiotic treatments for this,  although not always completing the course.   She was admitted this time with dizziness and nausea associated with some  hypotension. She also mentioned a history of pituitary microadenoma with  previous and current galactorrhea. She has previously been worked up for  this and has had numerous MRI's in the past. Seen by endocrinology,  neurology and perhaps neurosurgery. Most recently followed in [place] for  this. She states she has had no recent MRI greater than a year. The last MRI  of the brain here was in May of 2004, so two and a half years ago, which was  normal except for a questionable 3 mm microadenoma. She recently had a Spect-  Neurolite nuclear medicine study done of her brain, ordered by Dr. 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 I discussed  with Dr. Janeece Riggers. Shogry, radiologist, to hear what the import of this might  be and he is not  familiar with this technology or what Spect-Neurolite is  measuring per say. At any rate, she has had in addition to all these  symptoms of chronic pain syndrome related to cervical disease and going  problems with depression. She previously had a history of postpartum  depression, chronic pain secondary to cervical degenerative disease,  orthostatic hypotension with dizziness and blacking out episodes this  admission. This started up again about seven days prior to admission with  nausea and emesis of stomach contents. She also had some tinnitus sort of  presyncopal-type symptoms. She felt like her heart was racing and she was  tremulous.   REVIEW OF SYSTEMS:  As already described.   PAST MEDICAL HISTORY:  1.  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  2.  Postpartum depression.  3.  History of pyelonephritis.  4.  She had a cesarean section.  5.  Cervical disc surgery.  6.  Question of Lyme disease and a question of a 3 mm pituitary  microadenoma.   MEDICATIONS:  1.  Trazodone 100 mg q.h.s.  2.  Vicodin.  3.  Prozac.  4.  Klonopin.  5.  Ambien.  6.  Kadian.   ALLERGIES:  CORTISONE, SULFA, and CELEBREX.   SOCIAL HISTORY:  She lives in [place] with her husband and two children.  She does smoke. No alcohol or recreational drugs.   FAMILY HISTORY:  Positive for coronary artery disease.   PHYSICAL EXAMINATION:  VITAL SIGNS:  Temperature is 97.5, BP 100/50 (it was  quite a bit lower when she was admitted), pulse of 74, respirations 20.  Saturation 98% on room air.  HEENT:  Head is normocephalic and atraumatic.  NEUROLOGICAL:  She is a bit pale with a flat affect. She scores 29 out of 30  on the Mini-Mental Status Exam. Cranial nerves:  Pupils are equal and  reactive. Visual fields are to confrontation. Extraocular movements intact.  Facial sensation is normal. Facial motor activity is  normal. Hearing is  intact. Palate is symmetric. Tongue is midline. Motor exam:  There is no  drift or satelliting. She has normal rapid fine movements. She has normal  bulk, tone, and strength throughout. I did not ambulate her. Deep tendon  reflexes are 2+ and symmetric. Downgoing toes to plantar stimulation.  Coordination, finger-to-nose, and heel-to-shin are normal. Sensation is  intact without extinction.   IMPRESSION:  1.  Depression.  2.  Symptom complex of fatigue myalgias, cognitive complaints with      previously diagnosed as delayed Lyme. She has not traveled out of the      state except to [place] but she says she was frequently outside      and had dogs which would get ticks. She never actually had a known tick      exposure.  3.  Galactorrhea which is chronic and intermittent with a history of a      questionable 3 mm pituitary microadenoma.   RECOMMENDATIONS:  1.  I agree with recheck Lyme titer.  2.  Check prolactin level.  3.  Agree with endoscopy and psychiatric evaluation.  4.  I will defer to ID regarding Lyme treatment. We will ask Dr. Ninetta Lights to      let me know if she requires an LP at any point.      Catherine A. [name], M.D.  Electronically Signed     CAW/MEDQ  D:  11/28/2004  T:  11/29/2004  Job:  [id number]

## 2010-08-04 NOTE — H&P (Signed)
NAME:  [name] [name], [name] [name]                           ACCOUNT NO.:  [id number]   MEDICAL RECORD NO.:  [id number]                   PATIENT TYPE:  IPS   LOCATION:  0504                                 FACILITY:  [facility]   PHYSICIAN:  Margaret A. Scott, N.P.             DATE OF BIRTH:  06/13/1960   DATE OF ADMISSION:  11/18/2003  DATE OF DISCHARGE:  11/23/2003                         PSYCHIATRIC ADMISSION ASSESSMENT   IDENTIFYING INFORMATION:  This is a 50 year old, married white female who is  a voluntary admission.   HISTORY OF PRESENT ILLNESS:  This patient with a history of depression has  had a worsening of her symptoms since approximately January of 2005, when  Dr. Rosalene Billings closed his pain practice.  The patient then located a new  pain physician, who took her off of her OxyContin and she has been  struggling to adjust to new medication.  She reports increased depression  with anhedonia and neurovegetative symptoms, feeling lethargic, lies in bed  most of the day, is not able to get up to eat or drink.  She reports that  she has had some weight loss, but she cannot quantify it.  She has a history  of degenerative disk disease and had a diskectomy of C7 with the metal plate  screws inserted in 1999.  She is now on the medications Kadian for pain and  feels that it does not help her.  Her pain is getting up to a 10/10 several  times a week, currently rates her pain at a 6 to 7/10.  She had been on an  antidepressant for her depressed mood, but had not even gotten it refilled,  finding herself unable to get out of bed to keep her appointments.  She has  a 43-year-old child at home and is finding it difficult to do her daily ADLs  and attend properly to the 49 year old.  She has had suicidal thoughts, but  feels she has not even been able to organize how she might want to kill  herself.  She has been deterred from any suicide attempts by thinking of her  children, who are age 71, 17, and 3.   She denies any homicidal thought or  auditory or visual hallucinations.  She denies panic attacks or mood  fluctuations.   PAST PSYCHIATRIC HISTORY:  This is the first admission to [facility].  The patient does have a history of postpartum  depression after the birth of her last child and this was treated with  Risperdal and Prozac, and the patient did report doing quite well on this.  She was also hospitalized in [place] at one time for problems of anorexia.  This was approximately 15 years ago.  Most recently, the patient was at one  point on Paxil and Zoloft in the past for depression and does not remember  how well she responded because it  was too long ago for her to remember.  She  reports that she has done well on Prozac in the past.   SOCIAL HISTORY:  Patient is in her second marriage.  She has been married  now for the past 6 years, has a 6-year-old daughter by this marriage, has a  supportive husband.  She also has 2 sons by a prior marriage.  They are age  35 and 2.  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  Her  depression has prevented her from being able to maintain employment or  practice her profession.  No legal problems.   FAMILY HISTORY:  Patient denies alcohol and drug history.  Patient denies  any substance abuse.  She does smoke approximately 1-1/2 packs per day of  cigarettes.   MEDICAL HISTORY:  Patient is followed at [facility] and  medical problems are chronic neck pain after her diskectomy.  She also has a  history of pituitary tumor and has had an MRI done most recently at [facility].  She has a history of migraine headache.   PAST MEDICAL HISTORY:  Remarkable for surgery in 1999 on her neck, a C7  diskectomy, and a history of pyelonephritis x1.   REVIEW OF SYSTEMS:  Remarkable today for poor sleep and broken sleep all   night long, but never feeling rested.  No racing thoughts.  No  hallucinations.   CURRENT MEDICATIONS:  1.  Kadian 100 mg p.o. b.i.d.  2.  Vicodin for pain.   DRUG ALLERGIES:  CORTISONE and SULFA.   POSITIVE PHYSICAL FINDINGS:  This is a well-nourished, well-developed,  medium build female who appears in no distress.  Patient is 5 feet 1 inch  tall, 140 pounds, temperature is 99.7, pulse 78, respirations 18, blood  pressure 122/71.  Head is normocephalic and atraumatic.  She is somewhat  disheveled.  EENT:  PERRLA.  Sclerae nonicteric.  No rhinorrhea.  Oropharynx  is within normal limits.  Neck supple.  No thyromegaly.  Some range of  motion is limited because of some pain and neck stiffness.  No thyromegaly.  Chest:  Symmetrical.  Lungs:  Clear to auscultation.  Cardiovascular:  S1  and S2 are heard.  No murmurs, clicks, or gallops.  Breasts:  Exam deferred.  Abdomen:  Soft and nontender, nondistended.  No masses are appreciated.  Genitourinary:  Deferred.  Musculoskeletal:  Patient does have some  limitation of range of motion of her neck.  Movements are somewhat  stiffened, but she is fully functional, bears weight.  Gait is within normal  limits.  Neurologic:  Cranial nerves II-XII are intact.  Facial symmetry is  present.  Grip strength equal bilaterally.  Extraocular movements are  normal.  Deep tendon reflexes within normal limits and symmetrical.  Romberg  without findings.   DIAGNOSTIC STUDIES:  Reveal normal CBC.  WBC is 4,900, hemoglobin 11.6,  hematocrit 34.2, platelets normal at 243,000.  Routine chemistry reveals  normal electrolytes.  Her glucose was slightly elevated on this fasting  specimen at 104 mg/dl.  Her liver enzymes are within normal limits.  Kidney  function normal.  BUN 9, creatinine 0.7.  TSH is normal at 4.85.   MENTAL STATUS EXAM:  This is a fully alert female with somewhat slowed motor responses, displays psychomotor slowing.  Affect is blunted and a  bit  irritable.  She is withdrawn,  but warms quite a bit with conversation,  becomes more conversant.  Her speech reveals a normal tone, but sluggish  pace and slowed responses.  Mood is depressed, hopeless, helpless.  Thought  process is positive for some thought blocking, positive for suicidal  ideation without a clear plan on how she would proceed.  No homicidal  thought.  Cognitively she is intact and oriented x3.  Insight is somewhat  impaired.  Impulse control and judgment somewhat impaired.   AXIS I:  Major depression, recurrent, severe.  Rule out psychosis.   AXIS II:  Deferred.   AXIS III:  Chronic neck pain.  Migraine headaches.  Degenerative disk  disease.   AXIS IV:  Severe impairment in ADL due to depressive symptoms.   AXIS V:  Current 20, past year 41 estimated.   PLAN:  Voluntarily admit the patient with q.15 minute checks in place to  alleviate her depression and suicidal thought.  Since she has responded well  to Prozac in the past, we are going to restart that at 20 mg p.o. daily and  also will add to that for her neurovegetative symptoms Risperdal 0.25 mg  p.o. q.h.s. and 0.25 mg q.6h. p.r.n. for agitation.  She has taken Imitrex  successfully for migraine headaches in the past.  We will make that  available to her 6 mg q. p.r.n. at onset of a migraine, which may be  repeated in 2 hours p.r.n. for her migraine headache.  We will also, because  she has had some chronic constipation from her pain medicine, give her  Colace 100 mg daily and will force fluids to 800 cc q. shift and make some  MiraLax 17 grams q. day p.r.n. for constipation available to her.  Meanwhile, we are going to contact [facility] to obtain her  last 2 progress notes and we will get a copy of her MRI and just review that  since she has had some problems with a pituitary tumor in the past and  migraine headaches.   ESTIMATED LENGTH OF STAY:  Five to seven days.                                                Margaret A. Lorin Picket, N.P.    MAS/MEDQ  D:  11/30/2003  T:  11/30/2003  Job:  [id number]

## 2010-08-04 NOTE — H&P (Signed)
[facility]  Patient:    [name] [name], [name] [name]                        MRN: [id number] Adm. Date:  [id number] Disc. Date: [id number] Attending:  Genia Del                         History and Physical  PATIENT PROFILE:                  This is a 50 year old white female, G2,P2, with secondary infertility.  She is undergoing ______ treatment with artificial intrauterine insemination from husband.  REASON FOR ADMISSION:             Left flank pain, increased for one week, with  fever.  HISTORY OF PRESENT ILLNESS:       The patient underwent an intrauterine insemination without complication on April 25, 1999 as she had confirmation of ovulation by ultrasound; had received human chorionic gonadotropin 10,000 units the day before.  At that time she had mild left pelvic pain, probably associated with ovulation on that side.  Then, the pain changed and increased towards the left flank, and the patient had suprapubic pain with miction and mild burning with urination.                                    A urinalysis was done at the office on May 02, 1999.  This showed more than 10:5 E. coli.  She was treated at that time with Macrobid p.o. b.i.d.; without improvement.  PAST MEDICAL HISTORY:             Positive for chronic neck pain and migraines.  Also depression.  PAST SURGICAL HISTORY:            Cervical surgery at C7, breast augmentation, nd cesarean section x 2.  MEDICATIONS:                      Paxil, baclofen, Vioxx, OxyContin, ______, Zantac, ______.  ALLERGIES:                        No known drug allergies.  SOCIAL HISTORY:                   She is a smoker, smoking 1/2 pack a day.  FAMILY HISTORY:                   Positive for MI and hypertension for her father. She has a brother with ______ ______.  REVIEW OF SYSTEMS:                Constitutional -- The patient had chills and general feeling of aches and fatigue.   Respiratory -- Negative. Cardiovascular -- Negative.  Gastrointestinal -- Negative.  Urinary -- See HPI.  Gynecologic -- See HPI.  Neurologic -- Negative.  Dermatologic -- Negative.  PHYSICAL EXAMINATION:  GENERAL:                          The patient was in no apparent distress.  VITAL SIGNS:                      Blood pressure 100/70, pulse 80, respiratory ate 16,  temperature 98.5 after 12 hours of antibiotics intravenously (maximum 102.8 on admission).  LUNGS:                            Clear bilaterally, no adventitious sounds.  HEART:                            S1, S2 normal.  No S3, S4 and no murmur.  ABDOMEN:                          Soft, nontender.  Not distended.  No hepatosplenomegaly.  Bowel sounds positive.  BACK:                             Costovertebral angle tenderness positive on left, negative on right.  VAGINAL:                          Cervix is nontender to mobilization.  Uterus s anteverted, normal volume and nontender.  No adnexal mass.  No tenderness of adnexa.  EXTREMITIES:                      Lower limbs were normal.  Good positive DTR (2/4 bilaterally).  LABS:                             Blood cultures were pending.  Urine culture at the office on May 02, 1999 showed E. coli (more than 10:5); it was sensitive to NP and Macrobid.  IMPRESSION:                       Probable left acute pyelonephritis in a patient who had intrauterine insemination for secondary infertility.  Other diagnoses include depression (on Paxil) and migraines.  PLAN:                             Admit for IV antibiotics with ampicillin, ______ and gentamicin.  When afebrile for more than 24 hours will discharge on Macrobid. Renal ultrasound p.r.n. DD:  06/07/99 TD:  06/07/99 Job: [id number]

## 2010-08-04 NOTE — Discharge Summary (Signed)
[name] [name], [name] [name]                 ACCOUNT NO.:  [id number]   MEDICAL RECORD NO.:  [id number]          PATIENT TYPE:  INP   LOCATION:  5709                         FACILITY:  [facility]   PHYSICIAN:  [name], M.D.  DATE OF BIRTH:  [birth year]   DATE OF ADMISSION:  11/27/2004  DATE OF DISCHARGE:  12/04/2004                                 DISCHARGE SUMMARY   PRIMARY CARE PHYSICIAN:  [name] [name]   DISCHARGE DIAGNOSES:  1.  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  2.  Microprolactinoma.  3.  Major depression.  4.  History of cervical degenerative joint disease/chronic pain      syndrome/chronic fatigue.   DISCHARGE MEDICATIONS:  1.  Prozac 40 mg p.o. daily.  2.  Bromocriptine (Parlodel) 1.25 mg p.o. daily.  3.  Hydrocortisone 20 mg p.o. q.a.m. and 10 mg p.o. q.h.s.  4.  Florinef 0.1 mg p.o. daily.  5.  Ambien CR 25 mg p.o. daily (as own medications).  6.  Vicodin (5/500) one p.r.n. q.6h. for pain.  7.  May utilize diclofenac 75 mg p.o. b.i.d. with food for pain.  8.  Stop all other pre admission medications including Biaxin XL,      azithromycin, minocycline, Augmentin, Amoxil, Effexor, Paxil, trazodone,      and Kadian.   PROCEDURES:  1.  Brain MRI dated November 29, 2004.  This showed no acute intracranial      abnormality.  However, a 4 x 3 mm area of delayed contrast enhancement      is seen at the posterior aspect of the pituitary gland most consistent      with a pituitary microadenoma.  2.  Short Cortrosyn test dated November 30, 2004.  This showed the      following findings:  Pre test cortisol level 0.8, cortisol level post      injection of Cortrosyn 15.4, subsequently 19.1.   CONSULTS:  1.  Dr. [name], neurologist  2.  Dr. [name], infectious disease  3.  Dr. [name], endocrinologist  4.  Dr. [name], psychiatry   ADMISSION HISTORY:  As in H&P notes of November 27, 2004.   However, in  brief, this is a 50 year old female, with known history of major depression,  chronic cervical pain/cervical DJD, status post cervical disk surgery 1999,  who presents with increasing weakness and postural dizziness.  She had  recently been seen by her psychiatric nurse practitioner a week prior to  presentation, and had been commenced on trazodone for insomnia.  In  addition, she has a history of major depression and is still having  symptoms, consisting of lack of concentration, loss of interest in  activities, low energy, poor appetite.  She is on chronic pain medications  including Kadian for her chronic pain syndrome, secondary to cervical DJD.  At the time of initial evaluation she was found to have a blood pressure of  71/58 mmHg which dropped down to 57/42 on standing, i.e., significant  orthostasis.  She was admitted for further evaluation, investigation, and  management.   CLINICAL COURSE:  #1 - PROFOUND ORTHOSTASIS:  Patient was managed with  intravenous fluid hydration which resulted in significant amelioration of  her symptoms.  However, she continued to have systolic blood pressures,  which were somewhat on the low side, i.e., in the low 90s.  Initial serum  cortisol level showed an a.m. cortisol of 1.  Endocrinologist consultation  was called, which was kindly provided by Dr. [name] who performed a short  Cortrosyn test which showed the pre test cortisol level of 0.8 followed by a  post test level of 15.4 and subsequently 19.1.  Conclusion, was that these  results are consistent with hypocortisolism of a secondary nature, possible  etiology, use of morphine sulfate on a chronic basis by patient.  Per  endocrinologist`s recommendations she was started on replacement therapy  with hydrocortisone in physiologic doses which resulted in significant  improvement in patient's symptoms including resolution of dizziness.   #2 - MICROPROLACTINEMIA:  Patient also  presents with galactorrhea.  Brain  MRI of November 29, 2004 confirmed a 4 x 3 mm area of delayed contrast  enhancement in the posterior aspect of the pituitary gland.  Features, which  were deemed consistent with pituitary microadenoma.  Serum prolactin level  was 36.8.  Luteinizing hormone 31.5, FSH 11.3.  Plasma ACTH less than 5.  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  Per endocrinologist`s recommendations,  patient has therefore been started on bromocriptine, i.e., Parlodel in  initial dose of 1.25 mg daily.   #3 - CHRONIC PAIN SYNDROME:  Secondary to severe cervical DJD.  Patient has  been on chronic pain medication for this and it is felt that Kadian, which  is one of her pain medications, was responsible for her hypopituitarism  described in #1 above.  Kadian has therefore been stopped accordingly.  Patient is currently being managed with Vicodin p.r.n. and also diclofenac  b.i.d.   #4 - MAJOR DEPRESSION:  Patient has a history of major depression and has  exhibited features of lack of interest in activities, fatigue, apathy,  anhedonia.  Psychiatric consultation was called, which was kindly provided  by Dr. [name] who has recommended utilizing Prozac for management  of patient's symptoms and also recommended tapering of Klonopin, down to  initial dose of 0.25 mg b.i.d. p.r.n. with a goal of discontinuing.  This  was discontinued at the time of discharge.  In addition, Dr. Jeanie [name] had  also recommended trazodone 50 mg p.r.n. q.h.s. for insomnia.  Be that as it  may, patient was adequately managed with 10 mg of Ambien during the course  of her hospital stay.  Certainly, outpatient psychiatric follow-up is  indicated.  Patient has her own psychiatrist, Dr [name], who she has assured  [place] she will follow-up with.   #5 - CHRONIC FATIGUE:  Patient has apparently been plagued by chronic fatigue for some  time now.  It is entirely possible that this is secondary  to major depression.  See #4 above.  However, the question of possible Lyme  disease/chronic Lyme disease has been mooted in the past.  Neurology  consultation was called, which was kindly provided by Dr. [name] who  concluded that based on the information available to her, it was unlikely  that patient did indeed have Lyme Disease and recommended infectious disease  input.  This was kindly provided by Dr. [name] who arranged Lyme  Elisa, which was  negative.  Patient had been on multiple antibiotics prior  to admission.  She has been recommended that all these antibiotics be  discontinued.   DISPOSITION:  Patient was discharged in satisfactory condition on December 04, 2004.  On that day she was asymptomatic and was able to ambulate without  any further symptomatology.  Also, orthostasis had resolved.   DIET:  No restrictions.   ACTIVITY:  As tolerated.   WOUND CARE:  Not applicable.   FOLLOW-UP INSTRUCTIONS:  She is to follow up with Dr. [name],  endocrinologist in two weeks' time.  Telephone number: [phone].  This has  been supplied to the patient.  She is also to follow up within one to two  weeks, with Dr. [name], her psychiatrist, i.e., at [facility], and as a  matter of fact, an appointment has already been scheduled for December 20, 2004, at 9:45 a.m.  This has been communicated to her, and she has  verbalized understanding.  Patient has also been advised to follow up  routinely with her primary care physician at [facility],  per her already scheduled appointment.  All this has been communicated to  patient who has verbalized understanding.      [name], M.D.  Electronically Signed     CO/MEDQ  D:  12/06/2004  T:  12/06/2004  Job:  [id number]

## 2010-08-04 NOTE — Consult Note (Signed)
[name] [name], [name] [name]                 ACCOUNT NO.:  [id number]   MEDICAL RECORD NO.:  [id number]          PATIENT TYPE:  INP   LOCATION:  2908                         FACILITY:  [facility]   PHYSICIAN:  Alfonse Alpers. Gegick, M.D.DATE OF BIRTH:  [birth year]   DATE OF CONSULTATION:  11/29/2004  DATE OF DISCHARGE:                                   CONSULTATION   REFERRING SERVICE:  [facility].   HISTORY:  This is a 50 year old woman who has been in the hospital with  weakness and hypotension.  She has a history of having had some pituitary  disorder in the past.  She, in the year 2001, was told that she had an  elevated prolactin level and also that she had a microadenoma on a brain  scan.  She was having difficulty getting pregnant and then she did become  pregnant and had a full-term delivery; this was her third child.  She,  subsequent to that, did have breast discharge and breast-fed, and then she  had her menses interrupted by Depo-Provera.  She did note some galactorrhea.  Her menstrual periods remained without a period, but she was treated with  Depo-Provera, which was stopped about 1 year ago and approximately 3 months  ago she began having periods again.  Her menses, however, now are irregular.  In September of 2005, she had a prolactin level that was done and this was  8.5.  An MRI scan was done in November '05 and at that time, no pituitary  mass was seen.  A serum cortisol was done in September of 2005 and it was  2.0.  In April of 2006, she had more studies done at that time and she at  that time had a normal cortisol of 12.9 and she had a prolactin level of  3.0.  Her FSH and LH levels were normal.  A free T4 was 1.3, which was  normal, and she had a testosterone level of 41.  Her TSH was also normal at  2.17.  She presents now with a history of having profound weakness and was  moderately hypotensive.  Her electrolytes are now normal; her potassium was  3.5 and her sodium was  142.  During her evaluation here in the hospital, she  now has a prolactin level of 37 and an MRI brain scan that has shown a 3 x 4-  mm lesion in the pituitary (verbal preliminary report).   PAST MEDICAL HISTORY:   MEDICATIONS:  She has been receiving multiple psychotherapeutic drugs which  include Kadian (morphine), hydrocodone, Klonopin, Prozac, trazodone, and  Ambien.   PERSONAL HISTORY:  She smokes 1/2 to a pack of cigarettes per day, denies  excessive alcoholic intake.   ALLERGIES:  She states that she is allergic to SULFA and PREDNISONE makes  her feel shaky.   FAMILY HISTORY:  Her mother and father are both in their 71s and alive.  She  has 1 brother and 1 sister who are in good health and no pituitary disease.  She has 3 children; they are in good health.  REVIEW OF SYSTEMS:  CONSTITUTIONAL:  Her weight has decreased 40 pounds in  the last year.  CARDIOVASCULAR/RESPIRATORY:  No complaints.  GI:  No  complaints.   PHYSICAL EXAMINATION:  GENERAL:  This is a well-developed woman who appears  to have a very flat affect.  SKIN:  Her skin is fine.  No striae are present.  HEENT:  Her head is normocephalic without any evidence of problems.  NECK:  Her neck is supple.  The thyroid is not enlarged.  LUNGS:  Her lungs are clear.  CARDIOVASCULAR:  Rhythm is regular.  BREASTS:  Breasts show no masses, but some galactorrhea is present from the  right nipple.  ABDOMEN:  Her abdomen is soft and no masses are present.   IMPRESSION:  1.  Possible adrenal insufficiency (secondary).  2.  Prolactinoma with galactorrhea.   DISCUSSION:  The patient is clinically stable at this time.  I will obtain  baseline studies including a Cortrosyn stimulation test the first thing in  the morning and then providing these are confirmatory, I would start  medications.  It is unclear as to why her numbers have vacillated from  normal to high levels for prolactin and from normal to low levels for   cortisol.   Thank you for the opportunity of seeing this patient.           ______________________________  Alfonse Alpers. [name], M.D.     CGG/MEDQ  D:  11/29/2004  T:  11/30/2004  Job:  [id number]

## 2010-08-16 ENCOUNTER — Emergency Department (HOSPITAL_COMMUNITY): Payer: Medicare Other

## 2010-08-16 ENCOUNTER — Emergency Department (HOSPITAL_COMMUNITY)
Admission: EM | Admit: 2010-08-16 | Discharge: 2010-08-16 | Disposition: A | Discharge status: Home / Self Care | Payer: Medicare Other | Attending: Emergency Medicine | Admitting: Emergency Medicine

## 2010-08-16 DIAGNOSIS — F17200 Nicotine dependence, unspecified, uncomplicated: Secondary | ICD-10-CM | POA: Insufficient documentation

## 2010-08-16 DIAGNOSIS — R05 Cough: Secondary | ICD-10-CM | POA: Insufficient documentation

## 2010-08-16 DIAGNOSIS — N12 Tubulo-interstitial nephritis, not specified as acute or chronic: Secondary | ICD-10-CM | POA: Insufficient documentation

## 2010-08-16 DIAGNOSIS — R059 Cough, unspecified: Secondary | ICD-10-CM | POA: Insufficient documentation

## 2010-08-16 DIAGNOSIS — R109 Unspecified abdominal pain: Secondary | ICD-10-CM | POA: Insufficient documentation

## 2010-08-16 LAB — URINALYSIS, ROUTINE W REFLEX MICROSCOPIC
Bilirubin Urine: NEGATIVE
Glucose, UA: NEGATIVE mg/dL
Specific Gravity, Urine: 1.014 (ref 1.005–1.030)
Urobilinogen, UA: 0.2 mg/dL (ref 0.0–1.0)
pH: 5.5 (ref 5.0–8.0)

## 2010-08-16 LAB — URINE MICROSCOPIC-ADD ON

## 2010-10-13 ENCOUNTER — Emergency Department (HOSPITAL_COMMUNITY): Payer: Medicare Other

## 2010-10-13 ENCOUNTER — Emergency Department (HOSPITAL_COMMUNITY)
Admission: EM | Admit: 2010-10-13 | Discharge: 2010-10-13 | Disposition: A | Discharge status: Home / Self Care | Payer: Medicare Other | Attending: Emergency Medicine | Admitting: Emergency Medicine

## 2010-10-13 DIAGNOSIS — Z79899 Other long term (current) drug therapy: Secondary | ICD-10-CM | POA: Insufficient documentation

## 2010-10-13 DIAGNOSIS — M549 Dorsalgia, unspecified: Secondary | ICD-10-CM | POA: Insufficient documentation

## 2010-10-13 DIAGNOSIS — G8929 Other chronic pain: Secondary | ICD-10-CM | POA: Insufficient documentation

## 2010-10-13 DIAGNOSIS — M129 Arthropathy, unspecified: Secondary | ICD-10-CM | POA: Insufficient documentation

## 2010-10-13 DIAGNOSIS — F1010 Alcohol abuse, uncomplicated: Secondary | ICD-10-CM | POA: Insufficient documentation

## 2010-10-13 DIAGNOSIS — M542 Cervicalgia: Secondary | ICD-10-CM | POA: Insufficient documentation

## 2010-10-13 DIAGNOSIS — F3130 Bipolar disorder, current episode depressed, mild or moderate severity, unspecified: Secondary | ICD-10-CM | POA: Insufficient documentation

## 2010-10-25 ENCOUNTER — Other Ambulatory Visit: Payer: Self-pay | Admitting: Orthopedic Surgery

## 2010-10-25 DIAGNOSIS — M542 Cervicalgia: Secondary | ICD-10-CM

## 2010-10-26 ENCOUNTER — Ambulatory Visit
Admission: RE | Admit: 2010-10-26 | Discharge: 2010-10-26 | Disposition: A | Discharge status: Home / Self Care | Payer: Medicare Other | Source: Ambulatory Visit | Attending: Orthopedic Surgery | Admitting: Orthopedic Surgery

## 2010-10-26 DIAGNOSIS — M542 Cervicalgia: Secondary | ICD-10-CM

## 2010-10-27 ENCOUNTER — Encounter (HOSPITAL_COMMUNITY)
Admission: RE | Admit: 2010-10-27 | Discharge: 2010-10-27 | Disposition: A | Discharge status: Home / Self Care | Payer: Medicare Other | Source: Ambulatory Visit | Attending: Orthopedic Surgery | Admitting: Orthopedic Surgery

## 2010-10-27 LAB — DIFFERENTIAL
Basophils Absolute: 0 10*3/uL (ref 0.0–0.1)
Eosinophils Absolute: 0.1 10*3/uL (ref 0.0–0.7)
Eosinophils Relative: 2 % (ref 0–5)
Lymphocytes Relative: 38 % (ref 12–46)
Monocytes Absolute: 0.4 10*3/uL (ref 0.1–1.0)

## 2010-10-27 LAB — URINALYSIS, ROUTINE W REFLEX MICROSCOPIC
Glucose, UA: NEGATIVE mg/dL
Hgb urine dipstick: NEGATIVE
Protein, ur: NEGATIVE mg/dL
Specific Gravity, Urine: 1.026 (ref 1.005–1.030)

## 2010-10-27 LAB — COMPREHENSIVE METABOLIC PANEL
ALT: 14 U/L (ref 0–35)
Alkaline Phosphatase: 76 U/L (ref 39–117)
CO2: 28 mEq/L (ref 19–32)
GFR calc Af Amer: >60 mL/min (ref >60)
GFR calc non Af Amer: >60 mL/min (ref >60)
Glucose, Bld: 83 mg/dL (ref 70–99)
Potassium: 3.8 mEq/L (ref 3.5–5.1)
Sodium: 137 mEq/L (ref 135–145)

## 2010-10-27 LAB — PROTIME-INR: INR: 0.91 (ref 0.00–1.49)

## 2010-10-27 LAB — CBC
HCT: 41.2 % (ref 36.0–46.0)
MCHC: 34.2 g/dL (ref 30.0–36.0)
RDW: 12.8 % (ref 11.5–15.5)

## 2010-10-27 LAB — VITAMIN D 25 HYDROXY (VIT D DEFICIENCY, FRACTURES): Vit D, 25-Hydroxy: 36 ng/mL (ref 30–89)

## 2010-10-31 ENCOUNTER — Inpatient Hospital Stay (HOSPITAL_COMMUNITY)
Admission: RE | Admit: 2010-10-31 | Discharge: 2010-11-01 | DRG: 473 | Disposition: A | Discharge status: Home / Self Care | Payer: Medicare Other | Source: Ambulatory Visit | Attending: Orthopedic Surgery | Admitting: Orthopedic Surgery

## 2010-10-31 ENCOUNTER — Inpatient Hospital Stay (HOSPITAL_COMMUNITY): Payer: Medicare Other

## 2010-10-31 ENCOUNTER — Other Ambulatory Visit: Payer: Self-pay | Admitting: Orthopedic Surgery

## 2010-10-31 DIAGNOSIS — Z472 Encounter for removal of internal fixation device: Secondary | ICD-10-CM

## 2010-10-31 DIAGNOSIS — Z882 Allergy status to sulfonamides status: Secondary | ICD-10-CM

## 2010-10-31 DIAGNOSIS — Z888 Allergy status to other drugs, medicaments and biological substances status: Secondary | ICD-10-CM

## 2010-10-31 DIAGNOSIS — Z01812 Encounter for preprocedural laboratory examination: Secondary | ICD-10-CM

## 2010-10-31 DIAGNOSIS — J45909 Unspecified asthma, uncomplicated: Secondary | ICD-10-CM | POA: Diagnosis present

## 2010-10-31 DIAGNOSIS — F319 Bipolar disorder, unspecified: Secondary | ICD-10-CM | POA: Diagnosis present

## 2010-10-31 DIAGNOSIS — Z981 Arthrodesis status: Secondary | ICD-10-CM

## 2010-10-31 DIAGNOSIS — M5020 Other cervical disc displacement, unspecified cervical region: Principal | ICD-10-CM | POA: Diagnosis present

## 2010-10-31 DIAGNOSIS — M4802 Spinal stenosis, cervical region: Secondary | ICD-10-CM | POA: Diagnosis present

## 2010-10-31 DIAGNOSIS — G8929 Other chronic pain: Secondary | ICD-10-CM | POA: Diagnosis present

## 2010-10-31 DIAGNOSIS — F17200 Nicotine dependence, unspecified, uncomplicated: Secondary | ICD-10-CM | POA: Diagnosis present

## 2010-10-31 DIAGNOSIS — Z01810 Encounter for preprocedural cardiovascular examination: Secondary | ICD-10-CM

## 2010-11-01 LAB — GLUCOSE, CAPILLARY: Glucose-Capillary: 84 mg/dL (ref 70–99)

## 2010-12-06 NOTE — Discharge Summary (Signed)
  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  BRIEF HISTORY:  She was brought to [facility] on October 31, 2010.  She underwent surgery by Dr. Nelda Severe, removal of previous anterior plate at Z6-1, anterior decompression and fusion at C5-6.  The patient tolerated this well.  She also had a left-sided anterior iliac crest graft harvest.  Postoperatively she was admitted to room 5007. She was given Norco 5/325 one to two q.4 h. p.r.n. for pain.  She has had some nausea.  She has been given Zofran IV 2 mg p.r.n. for nausea. This morning, the drain was discontinued.  The incisions were well healed.  Clean and dry dressing was applied to the neck wound where the drain was discontinued.  The patient is swallowing, taking p.o.'s well. She states that she thinks her right arm feels better.  Her worst pain is at the incision on her neck as well as the hip incision at this point.  No complaints of vomiting.  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  PLAN:  She will walk for exercise.  No bending, lifting, stooping, dry dressing until no drainage.  She may shower tomorrow 24 hours after I have changed the dressing this morning at 8 a.m.  She will follow up in our office in approximately 1 week.  We want her to do a soft diet for the next 6 days, drink plenty of fluids.  Gentle neck range of motion is acceptable.  No brace is necessary.  Disposition is stable.  Diet is soft.     Lianne Cure, P.A.   ______________________________ Nelda Severe, MD    MC/MEDQ  D:  11/01/2010  T:  11/01/2010  Job:  [id number]  Electronically Signed by Lianne Cure P.A. on 11/23/2010 12:00:51 PM Electronically Signed by Nelda Severe MD on 12/06/2010 02:03:22 PM

## 2010-12-06 NOTE — Op Note (Signed)
NAMEKEISHAWNA, [name] [name]                 ACCOUNT NO.:  [id number]  MEDICAL RECORD NO.:  [id number]  LOCATION:  5007                         FACILITY:  [facility]  PHYSICIAN:  Nelda Severe, MD      DATE OF BIRTH:  15-Mar-1961  DATE OF PROCEDURE:  10/31/2010 DATE OF DISCHARGE:                              OPERATIVE REPORT   SURGEON:  Nelda Severe, MD  ASSISTANT:  Lianne Cure, PA-C  PREOPERATIVE DIAGNOSIS:  C5-6 spinal stenosis with a right brachialgia, status post C6-7 fusion (remote).  POSTOPERATIVE DIAGNOSIS:  C5-6 spinal stenosis with a right brachialgia, status post C6-7 fusion (remote).  OPERATIVE PROCEDURE: 1. Removal of previously placed anterior cervical plate at Z6-1. 2. Anterior decompression including spondylophytes at C5-6. 3. Interbody fusion C5-6. 4. Placement of interbody cage C5-C6. 5. Placement of anterior plate and screws at C5-6 (K2M). 6. Harvest left anterior iliac crest graft.  OPERATIVE NOTE:  The patient was placed under general endotracheal anesthesia.  A Foley catheter was placed in the bladder.  She was given intravenous antibiotics for prophylaxis against infection.  Sequential compression devices were placed on both lower extremities.  She was positioned supine on a regular operating table with a folded blanket under her scapula to aid in extension of the neck and a bump under the left buttock to elevate the left anterior iliac crest to facilitate graft harvest.  The arms were tucked at the sides.  The head was supported on foam.  An 18-gauge spinal needle was tapped to the skin on her neck at the what approximated the proposed site of incision.  A cross-table lateral radiograph taken which allowed [place] to fine tuning the position of the incision.  It also showed that we could get a technically adequate radiograph.  The anterior cervical area was prepped with DuraPrep as was the left anterior iliac crest area.  Prior to prepping, I marked on the  anterior iliac crest proposed site of incision, just distal to the crest and more than 2 inches posterior to the anterior superior iliac spine.  Square draping was applied secured with Ioban at each site.  At this time, a time-out was held during which the usual information was confirmed/discussed.  A left-sided transverse incision was made starting from the midline and extending to the anterior border of sternocleidomastoid at approximately C5-6 level.  After scoring the dermis, the subcutaneous layer was injected with 1% lidocaine with epinephrine.  Dissection was then carried down using cutting current through the dermis and through the platysma.  No tributaries of the external jugular system are encountered.  The entry point of the deep cervical fascia was identified along the anterior border of the sternocleidomastoid and bluntly dissected, keeping the carotid sheath laterally and the strap muscles medially.  The anterior spinal column was located with digital dissection.  Handheld retractor was then placed to retract the esophagus and trachea to the right side and plane along the anterior spinal column between the right and left longus colli muscles was developed.  The plate was obvious and soft tissue easily dissected off of it.  The handheld retractor was moved somewhat distally.  The locking screw was removed first  proximally and then distally and the 4 bone screws removed and the plate came off easily.  Obviously, further x-ray at this point was not necessary as the level was completely obvious being immediately proximal to the proximal end of the plate.  At this time, the longus colli muscles were further mobilized and then a side-to-side shadow line retractor placed.  Caspar pins were placed in the body of C5 and C6 and the distractor applied.  The anterior C5-6 disk, a very narrow, was incised with a 15-blade and then disk loosened up with curettes and delivered with  rongeurs.  We did use a high-speed bur to remove some of the inferior anterior lip of C5 to facilitate exposure.  Posteriorly, there was really no access to the spinal canal because of bone overgrowth from spondylophyte.  Combination of high- speed bur and fine curettes were used to remove bone back as far as the posterior longitudinal ligament, which ultimately was removed also. Bony decompression was performed side-to-side and particular attention was turned to the right neural foramen at C5-6 which was decompressed using a Kerrison rongeur.  The left-sided neural foramen was also decompressed with a Kerrison rongeur.  I did develop the plane deep to posterior longitudinal ligament and removed the posterior longitudinal ligament completely from right to left revealing the dura of the spinal cord.  The endplates were further prepared using combination of curettes and high-speed bur and ultimately a Titan cage, small footprint 6 mm with lordosis felt to be the best size.  I then harvested a bone graft.  The previously made skin marker, described above, was incised, subcutaneous tissue was injected with 1% lidocaine with epinephrine.  Dissection was carried down through this adipose tissue to the external oblique fascia which was retracted medially and hence the superior aspect of the anterior iliac crest identified.  A small area on the superior aspect of the iliac crest was denuded with cutting cautery, enough to admit the entry of a bone harvesting device about 1 cm in diameter.  This was used to remove an adequate core of bone for use in the cage.  I then packed the bony defect with Gelfoam to control bleeding and place one figure-of-eight #0 Vicryl suture from the gluteal to the external oblique fascia.  We then turned our attention back to the cervical area.  The above- mentioned cage was packed with bone and I gently impacted into place and countersunk about 1 mm.  A Caspar pins  were removed and as the construct compressed the Titan cage was well gripped.  We then chose appropriate length K2M anterior titanium cervical plate, so that the distal 2 screw holes completely avoided the screw holes from the previously applied plate (upper end).  Those screws were all placed and torqued.  They were 12 mm in length.  Cross-table lateral radiograph showed excellent position of implants and verified correct level of fusion.  A 7-gauge Silastic drain was placed in the prevertebral area and brought out through the skin to the right side of the cervical wound.  The platysma was closed using interrupted 0 Vicryl sutures which were inverted.  A few subcutaneous 2-0 Vicryl sutures were placed and the skin was closed using a running 3-0 undyed Vicryl suture in continuous fashion.  Skin edges were reinforced with Dermabond.  The iliac crest subcutaneous layer was closed using interrupted 2-0 Vicryl suture in inverted fashion.  The skin was closed using subcuticular 3-0 undyed Vicryl in running fashion.  Dermabond  was used to reinforce the skin edges.  Mepilex dressings were then applied.  The patient was then transferred to her bed at this time is awakening. There were no intraoperative complications.  Instrument, sponge, needle counts were correct.  Blood loss estimated at less than 25 mL.     Nelda Severe, MD     MT/MEDQ  D:  10/31/2010  T:  10/31/2010  Job:  [id number]  Electronically Signed by Nelda Severe MD on 12/06/2010 02:03:14 PM

## 2010-12-14 LAB — POCT I-STAT, CHEM 8
BUN: 23
Creatinine, Ser: 0.7
Hemoglobin: 13.9
Potassium: 3.6
Sodium: 142

## 2010-12-14 LAB — RAPID URINE DRUG SCREEN, HOSP PERFORMED
Barbiturates: NOT DETECTED
Opiates: NOT DETECTED
Tetrahydrocannabinol: NOT DETECTED

## 2010-12-14 LAB — URINALYSIS, ROUTINE W REFLEX MICROSCOPIC
Nitrite: NEGATIVE
Specific Gravity, Urine: 1.026
Urobilinogen, UA: 0.2

## 2011-05-03 ENCOUNTER — Ambulatory Visit (INDEPENDENT_AMBULATORY_CARE_PROVIDER_SITE_OTHER): Payer: Medicare Other | Admitting: Internal Medicine

## 2011-05-03 ENCOUNTER — Encounter: Payer: Self-pay | Admitting: Internal Medicine

## 2011-05-03 VITALS — BP 124/78 | HR 59 | Ht 61.0 in | Wt 154.4 lb

## 2011-05-03 DIAGNOSIS — F3289 Other specified depressive episodes: Secondary | ICD-10-CM

## 2011-05-03 DIAGNOSIS — F489 Nonpsychotic mental disorder, unspecified: Secondary | ICD-10-CM

## 2011-05-03 DIAGNOSIS — F17200 Nicotine dependence, unspecified, uncomplicated: Secondary | ICD-10-CM

## 2011-05-03 DIAGNOSIS — F329 Major depressive disorder, single episode, unspecified: Secondary | ICD-10-CM

## 2011-05-03 DIAGNOSIS — G4733 Obstructive sleep apnea (adult) (pediatric): Secondary | ICD-10-CM

## 2011-05-03 DIAGNOSIS — F32A Depression, unspecified: Secondary | ICD-10-CM

## 2011-05-03 DIAGNOSIS — IMO0002 Reserved for concepts with insufficient information to code with codable children: Secondary | ICD-10-CM

## 2011-05-03 DIAGNOSIS — Z23 Encounter for immunization: Secondary | ICD-10-CM

## 2011-05-03 DIAGNOSIS — F5105 Insomnia due to other mental disorder: Secondary | ICD-10-CM

## 2011-05-03 NOTE — Patient Instructions (Addendum)
Order- standard NPSG- not split.  Schedule with sleep center as a daytime study to match her usual sleep cycle    Dx OSA w/ insomnia  You might consider trying the cognitive behavioral therapy site    [email or web address]  Flu vax

## 2011-05-03 NOTE — Progress Notes (Signed)
05/03/11- 50 yoFsmoker here with female friend. Self-referred because of difficulty initiating and maintaining sleep. She recognizes this problem began as an adult, during the time of marital issues and depression leading to separation. She was working with a psychiatrist until about 14 months ago.  Usually can't fall asleep until 6 or 8:00 in the morning. Can't sleep for more than 2 or 3 hours at a time. Bedtime between 4 and 8 AM when she feels tired. Then sleep latency about 30 minutes. Wakes every 2 hours and usually stays up once awake. Gets up between 8 and 10 AM. Little caffeine during the day. Has tried Ambien, Lunesta, Dalmane, Restoril, chloral hydrate. Gets tolerance. Dalmane 30 mg plus Xanax 1 mg 3 times a day worked best. Had sleep walking with Ambien. Degenerative disc disease. Can't sit for long hours and this affects her sleep. Was in pain management for years but chose to get off of narcotics. Question of postpartum depression with daughter's birth. Has done cognitive behavioral therapy and biofeedback. Thyroid tested normal. She is running 5 miles a day for exercise. Mother had insomnia, helped by Ambien plus Xanax. Aware of at least some snoring.  ROS-see HPI Constitutional:   No-   weight loss, night sweats, fevers, chills, fatigue, lassitude. HEENT:   No-  headaches, difficulty swallowing, tooth/dental problems, sore throat,       No-  sneezing, itching, ear ache, nasal congestion, post nasal drip,  CV:  No-   chest pain, orthopnea, PND, swelling in lower extremities, anasarca, dizziness, palpitations Resp: No-   shortness of breath with exertion or at rest.              No-   productive cough,  No non-productive cough,  No- coughing up of blood.              No-   change in color of mucus.  No- wheezing.   Skin: No-   rash or lesions. GI:  No-   heartburn, indigestion, abdominal pain, nausea, vomiting, diarrhea,                 change in bowel habits, loss of appetite GU: No-    dysuria, change in color of urine, no urgency or frequency.  No- flank pain. MS: + joint pain or swelling, decreased range of motion,  back pain. Neuro-     nothing unusual Psych:  No-acute change in mood or affect. + depression or anxiety.  No memory loss.  OBJ- Physical Exam General- Alert, Oriented, Affect-calm, Distress- none acute Skin- rash-none, lesions- none, excoriation- none Lymphadenopathy- none Head- atraumatic            Eyes- Gross vision intact, PERRLA, conjunctivae and secretions clear            Ears- Hearing, canals-normal            Nose- Clear, no-Septal dev, mucus, polyps, erosion, perforation             Throat- Mallampati II , mucosa clear , drainage- none, tonsils- atrophic Neck- flexible , trachea midline, no stridor , thyroid nl, carotid no bruit. Healing incision, right anterior neck. Chest - symmetrical excursion , unlabored           Heart/CV- RRR , no murmur , no gallop  , no rub, nl s1 s2                           -  JVD- none , edema- none, stasis changes- none, varices- none           Lung- clear to P&A, wheeze- none, cough- none , dullness-none, rub- none           Chest wall-  Abd- tender-no, distended-no, bowel sounds-present, HSM- no Br/ Gen/ Rectal- Not done, not indicated Extrem- cyanosis- none, clubbing, none, atrophy- none, strength- nl Neuro- grossly intact to observation

## 2011-05-04 ENCOUNTER — Institutional Professional Consult (permissible substitution): Payer: Medicare Other | Admitting: Internal Medicine

## 2011-05-06 DIAGNOSIS — F17200 Nicotine dependence, unspecified, uncomplicated: Secondary | ICD-10-CM | POA: Insufficient documentation

## 2011-05-06 DIAGNOSIS — IMO0002 Reserved for concepts with insufficient information to code with codable children: Secondary | ICD-10-CM | POA: Insufficient documentation

## 2011-05-06 DIAGNOSIS — F5105 Insomnia due to other mental disorder: Secondary | ICD-10-CM | POA: Insufficient documentation

## 2011-05-06 DIAGNOSIS — F329 Major depressive disorder, single episode, unspecified: Secondary | ICD-10-CM | POA: Insufficient documentation

## 2011-05-06 DIAGNOSIS — F32A Depression, unspecified: Secondary | ICD-10-CM | POA: Insufficient documentation

## 2011-05-06 NOTE — Assessment & Plan Note (Signed)
Initial tobacco counseling.

## 2011-05-06 NOTE — Assessment & Plan Note (Signed)
Onset at least 10 years ago at time of marital stress and possibly related to recent childbirth. Her story strongly indicates depression. I can check again to make sure there are no current organic factors which she does not recognize, including disturbance of sleep by respiratory or movement disorder. She has tried all of the major sleep medications and I doubt that I will find a simple pharmaceutical approach. It may help to use a combination of an acute and longer lasting medication but ultimately resolution of her emotional component is likely to be key.

## 2011-05-08 ENCOUNTER — Encounter (HOSPITAL_BASED_OUTPATIENT_CLINIC_OR_DEPARTMENT_OTHER): Payer: Medicare Other

## 2011-05-20 ENCOUNTER — Encounter (HOSPITAL_BASED_OUTPATIENT_CLINIC_OR_DEPARTMENT_OTHER): Payer: Medicare Other

## 2011-05-23 ENCOUNTER — Ambulatory Visit: Payer: Medicare Other | Admitting: Internal Medicine

## 2011-07-13 ENCOUNTER — Encounter: Payer: Self-pay | Admitting: *Deleted

## 2011-07-13 ENCOUNTER — Encounter: Payer: Medicare Other | Attending: Obstetrics & Gynecology | Admitting: *Deleted

## 2011-07-13 DIAGNOSIS — E669 Obesity, unspecified: Secondary | ICD-10-CM | POA: Insufficient documentation

## 2011-07-13 DIAGNOSIS — Z713 Dietary counseling and surveillance: Secondary | ICD-10-CM | POA: Insufficient documentation

## 2011-07-13 NOTE — Patient Instructions (Addendum)
Plan: Include 1-2 carb choices (15-30 grams) at each meal,  Consider aerobic activity time to 1-2 hours Consider adding anaerobic such as Yoga in the meantime

## 2011-07-13 NOTE — Progress Notes (Signed)
  Medical Nutrition Therapy:  Appt start time: 1245 end time:  1345.  Assessment:  Primary concerns today: patient here for assistance with weight management after restricting calories and increasing activity level with no weight reduction. 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 She also states a history of over restricting food to control weight in the past   MEDICATIONS: see list   DIETARY INTAKE:  Usual eating pattern includes 2-3 meals and 0-1 snacks per day.  Everyday foods include variety of all food groups.  Avoided foods include high fat or sugar foods.    24-hr recall:  B ( AM): not lately, was eating small bowl raisin bran OR bread with PNB OR fresh fruit OR raspberry smoothie with skim milk  Snk ( AM): none  L ( PM): skip OR snack of fresh fruit Snk ( PM): none D ( PM): lean meat with steamed vegetable, water Snk ( PM): pistachios at desk in evening Beverages: water, diet Coke  Usual physical activity: runs 5 miles every day, in afternoon - ride 30 miles on bike and run 8 miles on trail in 3 hours 3-4 days a week  TANITA  BODY COMP RESULTS     %Fat 25%   Fat Mass (lbs) 34.01 lb.    Fat Free Mass (lbs) 102.5 lb   Total Body Water (lbs) 75.0 lb    Estimated energy needs: 1600 calories 180 g carbohydrates 120 g protein 44 g fat  Progress Towards Goal(s):  In progress.   Nutritional Diagnosis:  NB-2.2 Excessive physical activity As related to energy needs.  As evidenced by BMI of 25.9%. See Tanita Scale results as well.    Intervention:  Nutrition counseling provided by acknowledging that her body fat is actually too low and that she should consider less aerobic exercise while substituting anaerobic activities such as yoga. Discussed carb counting as way to monitor that portion of her calorie intake as she already limits her fat effectively.  Plan: Include 1-2 carb choices (15-30 grams) at each  meal,  Consider aerobic activity time to 1-2 hours Consider adding anaerobic such as Yoga in the meantime  Handouts given during visit include: Carb Counting and Food Label handouts Meal Plan Card  Tanita Scale Results  Monitoring/Evaluation:  Dietary intake, exercise modifications, reading food labels, and body weight in 4 week(s).

## 2011-08-04 ENCOUNTER — Encounter: Payer: Self-pay | Admitting: *Deleted

## 2011-08-08 ENCOUNTER — Encounter: Payer: Medicare Other | Attending: Obstetrics & Gynecology | Admitting: *Deleted

## 2011-08-08 ENCOUNTER — Encounter: Payer: Self-pay | Admitting: *Deleted

## 2011-08-08 DIAGNOSIS — Z713 Dietary counseling and surveillance: Secondary | ICD-10-CM | POA: Insufficient documentation

## 2011-08-08 DIAGNOSIS — E669 Obesity, unspecified: Secondary | ICD-10-CM | POA: Insufficient documentation

## 2011-08-08 NOTE — Patient Instructions (Signed)
Plan: Continue to include 1-2 carb choices (15-30 grams) at each meal, and 1 per snack especially before exercise Consider aerobic activity time to 1-2 hours Continue including anaerobic such as Yoga for stretching and reduce stress Consider working with a therapist to help with balance in exercise and work life balance

## 2011-08-08 NOTE — Progress Notes (Signed)
  Medical Nutrition Therapy:  Appt start time: 0915 end time:  0945.  Assessment:  Primary concerns today: follow up visit for weight management. Patient has history of excessive exercise and under eating with no weight loss. She states she did lose about 4 pounds in the first 2 weeks after our last appointment and has since stabilized. Further discussion reveals she did incorporate some non-aerobic yoga since last visit and she states she increased her carb choices as directed. However, 2 weeks ago, she also added a "hot yoga" class to her already physically active schedule, and she started restricting her carbs again when her weight loss slowed down. She states that she realizes that her level of activity is affecting her work hours and she compensates by working through the night, limiting her sleep to about 3-4 hours a day.  MEDICATIONS: see list   DIETARY INTAKE:  Usual eating pattern includes 2-3 meals and 0-1 snacks per day.  Everyday foods include variety of all food groups.  Avoided foods include high fat or sugar foods.    24-hr recall:  B ( AM): not lately, was eating small bowl raisin bran OR bread with PNB OR fresh fruit OR raspberry smoothie with skim milk  Snk ( AM): none  L ( PM): skip OR snack of fresh fruit Snk ( PM): none D ( PM): lean meat with steamed vegetable, water Snk ( PM): pistachios at desk in evening Beverages: water, diet Coke  Usual physical activity: runs 5 miles every day, in afternoon - ride 30 miles on bike and run 8 miles on trail in 3 hours 3-4 days a week  TANITA  BODY COMP RESULTS weight 136.0 lb   %Fat 32%   Fat Mass (lbs)       43.5 lb.    Fat Free Mass (lbs) 92.5 lb   Total Body Water (lbs) 67.5 lb    Estimated energy needs: 1600 calories 180 g carbohydrates 120 g protein 44 g fat  Progress Towards Goal(s):  In progress.   Nutritional Diagnosis:  NB-2.2 Excessive physical activity As related to energy needs.  As evidenced by BMI of  25.9%. See Tanita Scale results as well.    Intervention:  Weight loss of 4 pounds noted as well as increase of % body fat to a more normal and healthy percentage. Recommend she stop the "Hot Yoga", continue with normal Yoga class, continue with carb choices and suggest again she limit her aerobic activity to 1-2 hours per day. Suggested she consider working with a therapist to assist her with her obsessive behaviors, especially with history of anorexia years ago.  Plan: Continue to include 1-2 carb choices (15-30 grams) at each meal, and 1 per snack especially before exercise Consider aerobic activity time to 1-2 hours Continue including anaerobic such as Yoga for stretching and reduce stress Consider working with a therapist to help with balance in exercise and work life balance  Monitoring/Evaluation:  Dietary intake, exercise modifications, reading food labels, and body weight in 4 week(s).

## 2011-09-05 ENCOUNTER — Ambulatory Visit: Payer: Medicare Other | Admitting: *Deleted

## 2011-10-30 ENCOUNTER — Emergency Department (HOSPITAL_COMMUNITY)
Admission: EM | Admit: 2011-10-30 | Discharge: 2011-10-30 | Disposition: A | Discharge status: Home / Self Care | Payer: Medicare Other | Source: Home / Self Care | Attending: Emergency Medicine | Admitting: Emergency Medicine

## 2011-10-30 ENCOUNTER — Encounter (HOSPITAL_COMMUNITY): Payer: Self-pay | Admitting: Emergency Medicine

## 2011-10-30 DIAGNOSIS — R109 Unspecified abdominal pain: Secondary | ICD-10-CM

## 2011-10-30 DIAGNOSIS — Z8719 Personal history of other diseases of the digestive system: Secondary | ICD-10-CM

## 2011-10-30 LAB — POCT H PYLORI SCREEN: H. PYLORI SCREEN, POC: NEGATIVE

## 2011-10-30 LAB — CBC
MCH: 31 pg (ref 26.0–34.0)
MCHC: 34.9 g/dL (ref 30.0–36.0)
Platelets: 218 10*3/uL (ref 150–400)
RBC: 4.48 MIL/uL (ref 3.87–5.11)

## 2011-10-30 LAB — POCT I-STAT, CHEM 8
Calcium, Ion: 1.18 mmol/L (ref 1.12–1.23)
Chloride: 105 mEq/L (ref 96–112)
HCT: 43 % (ref 36.0–46.0)
TCO2: 21 mmol/L (ref 0–100)

## 2011-10-30 MED ORDER — SUCRALFATE 1 GM/10ML PO SUSP: 1.0000 g | Freq: Four times a day (QID) | ORAL | Status: DC

## 2011-10-30 MED ORDER — PANTOPRAZOLE SODIUM 40 MG PO TBEC: 40.0000 mg | DELAYED_RELEASE_TABLET | Freq: Every day | ORAL | Status: DC

## 2011-10-30 NOTE — ED Notes (Signed)
Abd pain x 7 days. Had blood in stool this am.

## 2011-10-30 NOTE — ED Notes (Signed)
C/o upper abd pain x 1 week. Also has had diarrhea which she describes as "black" until yesterday when it was "bright red'. Mild nausea without vomiting. Pain worse at night, "burning" epigastric pain not relieved by Mylanta, TUMS or Zantac. States has been under a lot of stress recently.

## 2011-10-30 NOTE — ED Provider Notes (Addendum)
History     CSN: [id number]  Arrival date & time 10/30/11  1150   First MD Initiated Contact with Patient 10/30/11 1157      Chief Complaint  Patient presents with  . Abdominal Pain    (Consider location/radiation/quality/duration/timing/severity/associated sxs/prior treatment) HPI Comments: Patient reports midline upper abdominal pain described as "burning", nausea, decreased appetite for the past week. She reports a significantly increased amount of stress recently and this is a abdominal pain is now waking her up at night. She says she has had up to 10 episodes of loose stools a day, and which have recently become black. Reports a painless episode of hematochezia this morning. She's been taking Mylanta, TUMS, Zofran, Zantac without relief. Is not taking any Pepto-Bismol. No apparent aggravating factors. Is not related to eating, fasting, having a bowel movement. She is a smoker. No alcohol or excessive NSAID use. No fevers,, vomiting, abdominal distention. No chest pain, shortness of breath, dyspnea on exertion, presyncope, syncope. No vaginal bleeding, vaginal discharge. No urinary urgency, frequency, dysuria. No history of PUD, hypertension, diabetes, cancer. No family history of GI issues. Past surgical history significant for cholecystectomy. She has never had a colonoscopy or endoscopy.  ROS as noted in HPI. All other ROS negative.   Patient is a 51 y.o. female presenting with abdominal pain. The history is provided by the patient. No language interpreter was used.  Abdominal Pain The primary symptoms of the illness include abdominal pain. The current episode started more than 2 days ago. The problem has been gradually worsening.  The patient states that she believes she is currently not pregnant. The patient has had a change in bowel habit. Risk factors for an acute abdominal problem include a history of abdominal surgery. Symptoms associated with the illness do not include chills,  anorexia, diaphoresis, heartburn, constipation, urgency or hematuria. Significant associated medical issues include gallstones. Significant associated medical issues do not include PUD, GERD, inflammatory bowel disease, diabetes, liver disease, substance abuse or diverticulitis.    Past Medical History  Diagnosis Date  . ALLERGIC RHINITIS   . Chronic headaches   . DDD (degenerative disc disease)   . Asthma     Past Surgical History  Procedure Date  . Cesarean section     x3  . Cholecystectomy 08-2009  . Neck/back surgery 1998; 2012    Family History  Problem Relation Age of Onset  . Heart disease Father     non smoker  . Heart disease Paternal Grandfather     unknown if smoker-deceased when pt was 51 yrs old    History  Substance Use Topics  . Smoking status: Current Everyday Smoker -- 1.0 packs/day for 18 years    Types: Cigarettes  . Smokeless tobacco: Never Used  . Alcohol Use: No     scotch drinker previously-been months without    OB History    Grav Para Term Preterm Abortions TAB SAB Ect Mult Living                  Review of Systems  Constitutional: Negative for chills and diaphoresis.  Gastrointestinal: Positive for abdominal pain. Negative for heartburn, constipation and anorexia.  Genitourinary: Negative for urgency and hematuria.    Allergies  Prednisone and Sulfonamide derivatives  Home Medications   Current Outpatient Rx  Name Route Sig Dispense Refill  . ALPRAZOLAM 0.5 MG PO TABS Oral Take 0.5 mg by mouth at bedtime as needed.    Marland Kitchen ALUM & MAG HYDROXIDE-SIMETH  500-450-40 MG/5ML PO SUSP Oral Take by mouth every 6 (six) hours as needed.    Marland Kitchen CALCIUM CARBONATE ANTACID 500 MG PO CHEW Oral Chew 1 tablet by mouth daily.    Marland Kitchen ONDANSETRON HCL 4 MG PO TABS Oral Take 4 mg by mouth every 8 (eight) hours as needed.    Marland Kitchen RANITIDINE HCL 150 MG PO CAPS Oral Take 150 mg by mouth 2 (two) times daily.    Marland Kitchen PANTOPRAZOLE SODIUM 40 MG PO TBEC Oral Take 1 tablet (40  mg total) by mouth daily. 20 tablet 0  . SUCRALFATE 1 GM/10ML PO SUSP Oral Take 10 mLs (1 g total) by mouth 4 (four) times daily. 10 mL before meals and before bedtime 240 mL 0  . TIZANIDINE HCL 4 MG PO CAPS Oral Take 4 mg by mouth 3 (three) times daily.      BP 132/86  Pulse 56  Temp 97.9 F (36.6 C) (Oral)  Resp 16  SpO2 100%  LMP 10/23/2011 Filed Vitals:   10/30/11 1223 10/30/11 1343 10/30/11 1343 10/30/11 1344  BP: 119/77 118/71 133/79 132/86  Pulse: 58 55 54 56  Temp: 97.9 F (36.6 C)     TempSrc: Oral     Resp: 16     SpO2: 100%        Physical Exam  Nursing note and vitals reviewed. Constitutional: She is oriented to person, place, and time. She appears well-developed and well-nourished. No distress.  HENT:  Head: Normocephalic and atraumatic.  Eyes: Conjunctivae and EOM are normal.  Neck: Normal range of motion.  Cardiovascular: Normal rate and regular rhythm.   Pulmonary/Chest: Effort normal and breath sounds normal.  Abdominal: Soft. Bowel sounds are normal. She exhibits no distension. There is no tenderness. There is no rigidity, no rebound, no guarding and no CVA tenderness.       Healed laparoscopic scars.  Genitourinary: Rectum normal. Rectal exam shows no external hemorrhoid, no internal hemorrhoid, no fissure, no mass, no tenderness and anal tone normal. Guaiac negative stool.       no active bleeding seen on anoscopy.  Musculoskeletal: Normal range of motion.  Neurological: She is alert and oriented to person, place, and time. Coordination normal.  Skin: Skin is warm and dry.  Psychiatric: She has a normal mood and affect. Her behavior is normal. Judgment and thought content normal.    ED Course  Procedures (including critical care time)   Labs Reviewed  OCCULT BLOOD, POC DEVICE  CBC  POCT H PYLORI SCREEN  POCT I-STAT, CHEM 8   No results found.   1. Abdominal pain   2. History of GI bleed     Results for orders placed during the hospital  encounter of 10/30/11  OCCULT BLOOD, POC DEVICE      Component Value Range   Fecal Occult Bld NEGATIVE    CBC      Component Value Range   WBC 6.0  4.0 - 10.5 K/uL   RBC 4.48  3.87 - 5.11 MIL/uL   Hemoglobin 13.9  12.0 - 15.0 g/dL   HCT 16.1  09.6 - 04.5 %   MCV 88.8  78.0 - 100.0 fL   MCH 31.0  26.0 - 34.0 pg   MCHC 34.9  30.0 - 36.0 g/dL   RDW 40.9  81.1 - 91.4 %   Platelets 218  150 - 400 K/uL  POCT H PYLORI SCREEN      Component Value Range   H. PYLORI SCREEN,  POC NEGATIVE  NEGATIVE  POCT I-STAT, CHEM 8      Component Value Range   Sodium 138  135 - 145 mEq/L   Potassium 4.0  3.5 - 5.1 mEq/L   Chloride 105  96 - 112 mEq/L   BUN 20  6 - 23 mg/dL   Creatinine, Ser 9.60  0.50 - 1.10 mg/dL   Glucose, Bld 84  70 - 99 mg/dL   Calcium, Ion 4.54  0.98 - 1.23 mmol/L   TCO2 21  0 - 100 mmol/L   Hemoglobin 14.6  12.0 - 15.0 g/dL   HCT 11.9  14.7 - 82.9 %    MDM  Previous records reviewed. Patient smoker. History of insomnia, depression. History of neck surgery  Vitals acceptable, patient is not reporting any signs of symptomatic anemia, hypovolemia. Abdomen is soft, No signs of surgical abdomen or active GI bleed on physical exam. Will check CBC, H. Pylori. This is all negative, will refer for emergent scope.  Labs reviewed.. Patient is not orthostatic. Cherly Anderson has arranged a followup appointment with the with GI tomorrow, August 14 at 9:15 AM. Discussed labs, MDM, and plan for close followup with patient, who agrees with plan. We'll start her on some protonix, Carafate, have her continue the Zantac. Discussed signs and symptoms that should prompt return to the ER. Patient agrees with plan.  Luiz Blare, MD 10/30/11 1412  Luiz Blare, MD 10/30/11 2121

## 2011-10-30 NOTE — ED Notes (Signed)
Referral request for follow- up for [facility] by Dr. Chaney Malling.  I called the office and scheduled appointment for 8/14 @ 0915. Dr. Chaney Malling notified. Pt. states she can go then.  Instructed pt. to arrive at 0900, bring her drivers license, Insurance card and medications.   [name] [name] 10/30/2011

## 2011-11-06 ENCOUNTER — Other Ambulatory Visit: Payer: Self-pay | Admitting: Gastroenterology

## 2012-02-29 IMAGING — CR DG CERVICAL SPINE 2 OR 3 VIEWS
1 series · 1 of 1 positions shown · non-contrast
Comparison: 10/26/2010.

CLINICAL DATA: C5-C6 ACDF.

CERVICAL SPINE - 2-3 VIEW

[view not recorded]
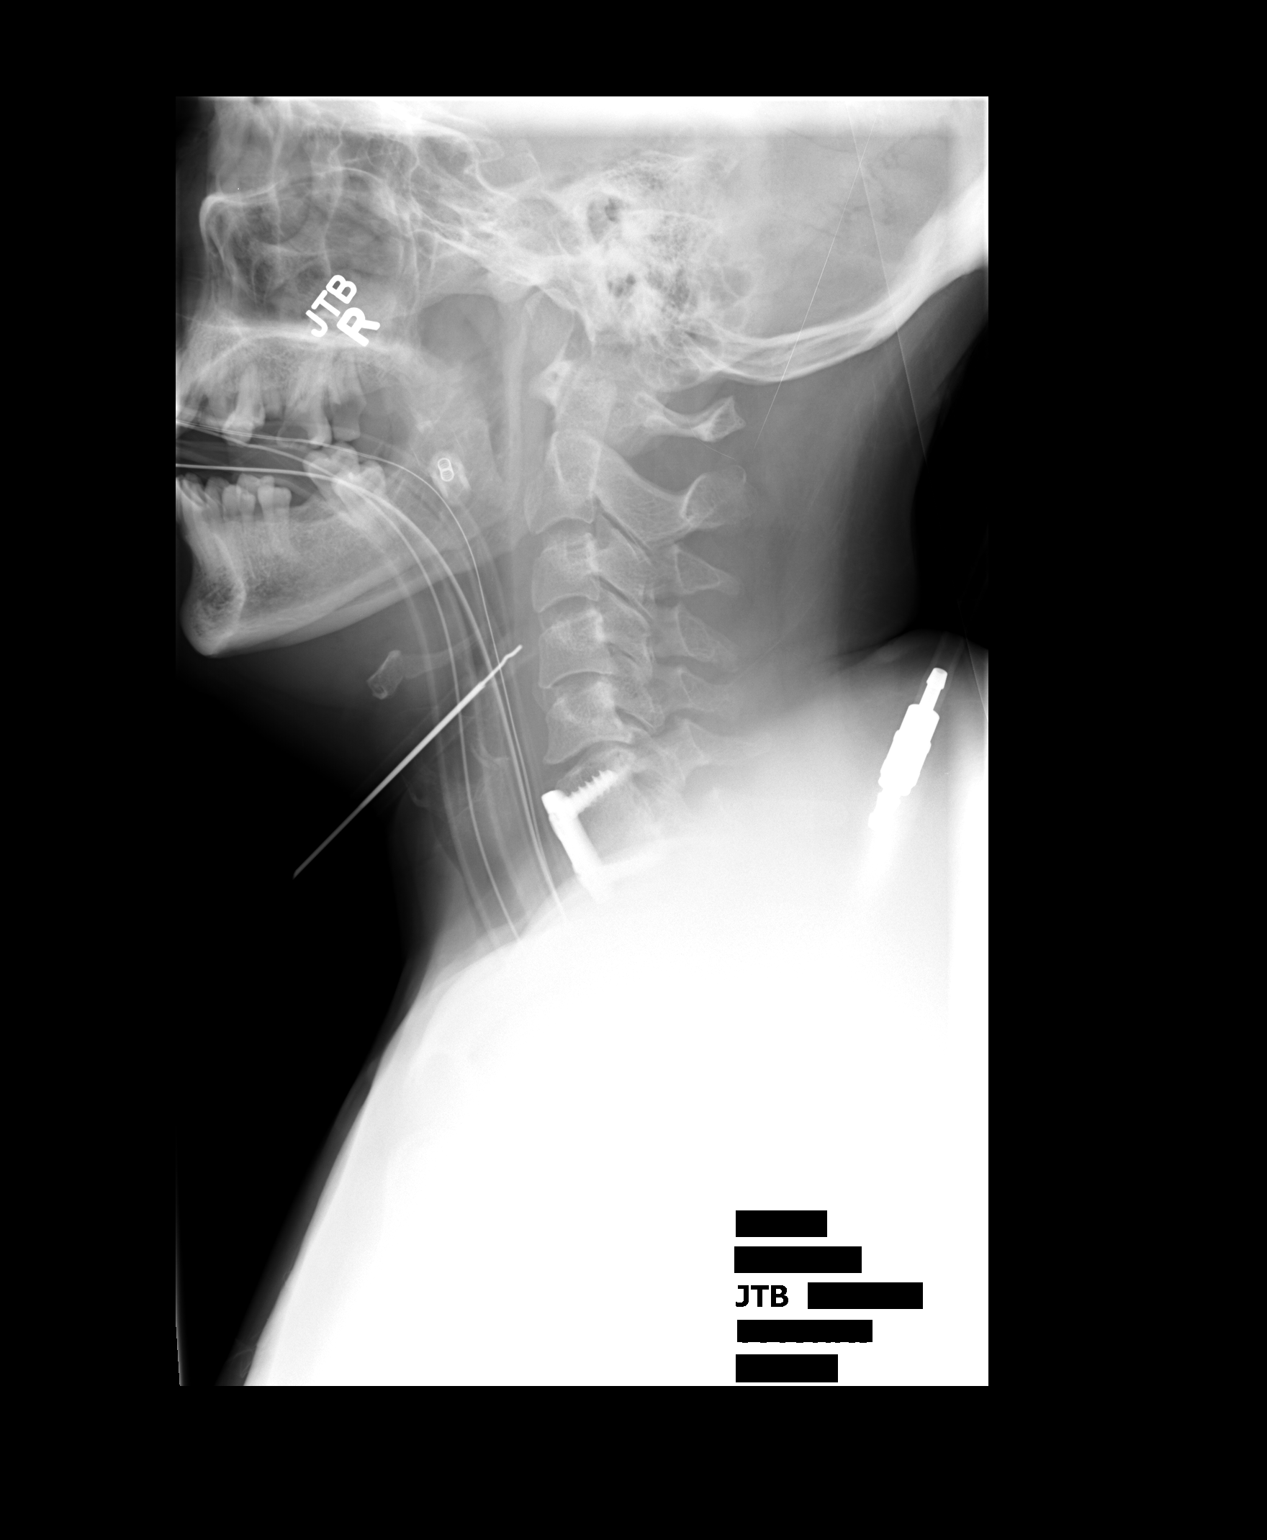

[1 of 1 positions shown; findings below may reference images not displayed]

FINDINGS: 2 intraoperative lateral localization radiographs are
submitted for interpretation.  These demonstrate C6-C7 ACDF plate.
Nasogastric tube and endotracheal tube are present.  Initial
localization image shows a probe over the C3-C4 interspace
anteriorly.  Subsequent images demonstrate removal of the C6-C7
ACDF plate, C5-C6 discectomy and placement of the C5-C6 ACDF appear
IMPRESSION: C5-C6 ACDF.

## 2012-07-22 ENCOUNTER — Other Ambulatory Visit: Payer: Self-pay | Admitting: Obstetrics & Gynecology

## 2012-07-22 ENCOUNTER — Encounter (HOSPITAL_COMMUNITY): Payer: Self-pay | Admitting: Pharmacist

## 2012-07-28 ENCOUNTER — Ambulatory Visit (HOSPITAL_COMMUNITY)
Admission: RE | Admit: 2012-07-28 | Discharge: 2012-07-28 | Disposition: A | Discharge status: Home / Self Care | Payer: Medicare Other | Source: Ambulatory Visit | Attending: Obstetrics & Gynecology | Admitting: Obstetrics & Gynecology

## 2012-07-28 ENCOUNTER — Encounter (HOSPITAL_COMMUNITY): Payer: Self-pay | Admitting: Certified Registered"

## 2012-07-28 ENCOUNTER — Ambulatory Visit (HOSPITAL_COMMUNITY): Payer: Medicare Other | Admitting: Certified Registered"

## 2012-07-28 ENCOUNTER — Encounter (HOSPITAL_COMMUNITY)
Admission: RE | Disposition: A | Discharge status: Home / Self Care | Payer: Self-pay | Source: Ambulatory Visit | Attending: Obstetrics & Gynecology

## 2012-07-28 DIAGNOSIS — N921 Excessive and frequent menstruation with irregular cycle: Secondary | ICD-10-CM | POA: Insufficient documentation

## 2012-07-28 DIAGNOSIS — R9389 Abnormal findings on diagnostic imaging of other specified body structures: Secondary | ICD-10-CM | POA: Insufficient documentation

## 2012-07-28 HISTORY — PX: NOVASURE ABLATION: SHX5394

## 2012-07-28 LAB — CBC
Platelets: 206 10*3/uL (ref 150–400)
RDW: 12.9 % (ref 11.5–15.5)
WBC: 5.5 10*3/uL (ref 4.0–10.5)

## 2012-07-28 LAB — PREGNANCY, URINE: Preg Test, Ur: NEGATIVE

## 2012-07-28 SURGERY — NOVASURE ABLATION
Anesthesia: General | Site: Uterus | Wound class: Clean Contaminated

## 2012-07-28 MED ORDER — FENTANYL CITRATE 0.05 MG/ML IJ SOLN
INTRAMUSCULAR | Status: DC | PRN
  Administered 2012-07-28: 50 ug via INTRAVENOUS
  Administered 2012-07-28: 100 ug via INTRAVENOUS
  Administered 2012-07-28 (×2): 50 ug via INTRAVENOUS

## 2012-07-28 MED ORDER — LIDOCAINE HCL (CARDIAC) 20 MG/ML IV SOLN
INTRAVENOUS | Status: DC | PRN
  Administered 2012-07-28: 100 mg via INTRAVENOUS

## 2012-07-28 MED ORDER — KETOROLAC TROMETHAMINE 30 MG/ML IJ SOLN
INTRAMUSCULAR | Status: DC | PRN
  Administered 2012-07-28: 30 mg via INTRAVENOUS

## 2012-07-28 MED ORDER — FENTANYL CITRATE 0.05 MG/ML IJ SOLN
25.0000 ug | INTRAMUSCULAR | Status: DC | PRN
  Administered 2012-07-28: 50 ug via INTRAVENOUS

## 2012-07-28 MED ORDER — MEPERIDINE HCL 25 MG/ML IJ SOLN: 6.2500 mg | INTRAMUSCULAR | Status: DC | PRN

## 2012-07-28 MED ORDER — METOCLOPRAMIDE HCL 5 MG/ML IJ SOLN: 10.0000 mg | Freq: Once | INTRAMUSCULAR | Status: DC | PRN

## 2012-07-28 MED ORDER — MIDAZOLAM HCL 5 MG/5ML IJ SOLN
INTRAMUSCULAR | Status: DC | PRN
  Administered 2012-07-28: 2 mg via INTRAVENOUS

## 2012-07-28 MED ORDER — STERILE WATER FOR IRRIGATION IR SOLN
Status: DC | PRN
  Administered 2012-07-28: 1000 mL

## 2012-07-28 MED ORDER — CHLOROPROCAINE HCL 1 % IJ SOLN
INTRAMUSCULAR | Status: AC
  Filled 2012-07-28: qty 30

## 2012-07-28 MED ORDER — FENTANYL CITRATE 0.05 MG/ML IJ SOLN
INTRAMUSCULAR | Status: AC
  Filled 2012-07-28: qty 2

## 2012-07-28 MED ORDER — LACTATED RINGERS IV SOLN
INTRAVENOUS | Status: DC
  Administered 2012-07-28 (×2): via INTRAVENOUS

## 2012-07-28 MED ORDER — DEXAMETHASONE SODIUM PHOSPHATE 4 MG/ML IJ SOLN
INTRAMUSCULAR | Status: DC | PRN
  Administered 2012-07-28: 10 mg via INTRAVENOUS

## 2012-07-28 MED ORDER — PROPOFOL 10 MG/ML IV EMUL
INTRAVENOUS | Status: DC | PRN
  Administered 2012-07-28: 200 mg via INTRAVENOUS

## 2012-07-28 MED ORDER — CEFAZOLIN SODIUM-DEXTROSE 2-3 GM-% IV SOLR
2.0000 g | INTRAVENOUS | Status: AC
  Administered 2012-07-28: 2 g via INTRAVENOUS

## 2012-07-28 MED ORDER — ONDANSETRON HCL 4 MG/2ML IJ SOLN
INTRAMUSCULAR | Status: DC | PRN
  Administered 2012-07-28: 4 mg via INTRAVENOUS

## 2012-07-28 MED ORDER — CHLOROPROCAINE HCL 1 % IJ SOLN
INTRAMUSCULAR | Status: DC | PRN
  Administered 2012-07-28: 20 mL

## 2012-07-28 MED ORDER — OXYCODONE-ACETAMINOPHEN 7.5-325 MG PO TABS: 1.0000 | ORAL_TABLET | ORAL | Status: DC | PRN

## 2012-07-28 MED ORDER — KETOROLAC TROMETHAMINE 30 MG/ML IJ SOLN
INTRAMUSCULAR | Status: AC
  Filled 2012-07-28: qty 1

## 2012-07-28 SURGICAL SUPPLY — 14 items
ABLATOR ENDOMETRIAL BIPOLAR (ABLATOR) ×2 IMPLANT
CATH ROBINSON RED A/P 16FR (CATHETERS) ×2 IMPLANT
CLOTH BEACON ORANGE TIMEOUT ST (SAFETY) ×2 IMPLANT
CONTAINER PREFILL 10% NBF 60ML (FORM) IMPLANT
GLOVE BIO SURGEON STRL SZ 6.5 (GLOVE) ×2 IMPLANT
GLOVE BIOGEL PI IND STRL 7.0 (GLOVE) ×1 IMPLANT
GLOVE BIOGEL PI INDICATOR 7.0 (GLOVE) ×2
GOWN PREVENTION PLUS LG XLONG (DISPOSABLE) ×4 IMPLANT
NDL SPNL 22GX3.5 QUINCKE BK (NEEDLE) ×1 IMPLANT
NEEDLE SPNL 22GX3.5 QUINCKE BK (NEEDLE) ×2 IMPLANT
PACK VAGINAL MINOR WOMEN LF (CUSTOM PROCEDURE TRAY) ×2 IMPLANT
SYR CONTROL 10ML LL (SYRINGE) ×2 IMPLANT
TOWEL OR 17X24 6PK STRL BLUE (TOWEL DISPOSABLE) ×4 IMPLANT
WATER STERILE IRR 1000ML POUR (IV SOLUTION) ×2 IMPLANT

## 2012-07-28 NOTE — H&P (Signed)
[name] [name] is an 52 y.o. female  3 C/S  RP:  Irregular bleeding in perimenopause for D+C, Novasure endometrial ablation.  Pertinent Gynecological History: Menses: menometro Bleeding: Perimenopausal/menopausal bleeding Blood transfusions: none Sexually transmitted diseases: no previous history  Last mammogram: normal Last pap: normal OB History:  3 C/S   Menstrual History: No LMP recorded.    Past Medical History  Diagnosis Date  . ALLERGIC RHINITIS   . Chronic headaches   . DDD (degenerative disc disease)   . Asthma     Past Surgical History  Procedure Laterality Date  . Cesarean section      x3  . Cholecystectomy  08-2009  . Neck/back surgery  1998; 2012    Family History  Problem Relation Age of Onset  . Heart disease Father     non smoker  . Heart disease Paternal Grandfather     unknown if smoker-deceased when pt was 52 yrs old    Social History:  reports that she has been smoking Cigarettes.  She has a 18 pack-year smoking history. She has never used smokeless tobacco. She reports that she does not drink alcohol or use illicit drugs.  Allergies:  Allergies  Allergen Reactions  . Prednisone Hives and Other (See Comments)    hyper  . Sulfonamide Derivatives Other (See Comments)    Unknown reaction    Prescriptions prior to admission  Medication Sig Dispense Refill  . ibuprofen (ADVIL,MOTRIN) 200 MG tablet Take 600 mg by mouth every 8 (eight) hours as needed for pain.      . ranitidine (ZANTAC) 150 MG capsule Take 150 mg by mouth daily.       Marland Kitchen tiZANidine (ZANAFLEX) 4 MG capsule Take 6 mg by mouth 3 (three) times daily.         Pulse 68, temperature 98.2 F (36.8 C), temperature source Oral, resp. rate 20, height 5\' 2"  (1.575 m), weight 68.04 kg (150 lb), SpO2 100.00%.   Pelvic US Thin normal endometrium.  Results for orders placed during the hospital encounter of 07/28/12 (from the past 24 hour(s))  CBC     Status: None   Collection Time   07/28/12 11:05 AM      Result Value Range   WBC 5.5  4.0 - 10.5 K/uL   RBC 4.39  3.87 - 5.11 MIL/uL   Hemoglobin 14.1  12.0 - 15.0 g/dL   HCT 81.1  91.4 - 78.2 %   MCV 93.2  78.0 - 100.0 fL   MCH 32.1  26.0 - 34.0 pg   MCHC 34.5  30.0 - 36.0 g/dL   RDW 95.6  21.3 - 08.6 %   Platelets 206  150 - 400 K/uL    No results found.  Assessment/Plan: Menometro for D+C, Novasure endometrial ablation.  Surgery and risks reviewed.   [name] [name],[name] [name] 07/28/2012, 11:31 AM

## 2012-07-28 NOTE — Anesthesia Postprocedure Evaluation (Signed)
Anesthesia Post Note  Patient: [name] [name]  Procedure(s) Performed: Procedure(s) (LRB): D&C NOVASURE ABLATION (N/A)  Anesthesia type: General  Patient location: PACU  Post pain: Pain level controlled  Post assessment: Post-op Vital signs reviewed  Last Vitals:  Filed Vitals:   07/28/12 1330  BP: 127/93  Pulse: 73  Temp: 36.5 C  Resp: 19    Post vital signs: Reviewed  Level of consciousness: sedated  Complications: No apparent anesthesia complications

## 2012-07-28 NOTE — Op Note (Signed)
07/28/2012  12:30 PM  PATIENT:  [name] [name]  52 y.o. female  PRE-OPERATIVE DIAGNOSIS:  Metrorrhagia    POST-OPERATIVE DIAGNOSIS:  metrorrhagia  PROCEDURE:  Procedure(s): D&C NOVASURE ABLATION  SURGEON:  Surgeon(s): Genia Del, MD  ASSISTANTS: none   ANESTHESIA:   general  PROCEDURE:  Under general anesthesia with the laryngeal mask the patient is in lithotomy position. She is prepped with Betadine on the suprapubic vulvar and vaginal areas. She is draped as usual.  The vaginal exam reveals an anteverted uterus mobile no adnexal mass. The speculum is inserted in the vagina. The anterior lip of the cervix was grasped with a tenaculum. A paracervical block is done with lidocaine 20 cc total at 4 and 8:00. The cervical length is 4.0 centimeters and the hysterometry is at 9.5 cm, for a cavity length of 5.5 cm.  Dilation of the cervix with Hegar dilators up to #35 without difficulty. A sharp curet is used to curettage the intrauterine cavity on all surfaces.  The specimen was sent to pathology. We then inserted the NovaSure device and the intrauterine cavity. The width is measured at 4.5 cm. The integrity test is passed.  The NovaSure ablation is done for 2 minutes at a power of 136. The Novasure instrument is then removed.  The tenaculum was removed as well. Hemostasis is adequate. The speculum is removed. The patient is transferred to recovery room in good and stable status.  ESTIMATED BLOOD LOSS:  10 cc   Intake/Output Summary (Last 24 hours) at 07/28/12 1230 Last data filed at 07/28/12 1225  Gross per 24 hour  Intake   1000 ml  Output     30 ml  Net    970 ml     BLOOD ADMINISTERED:none   LOCAL MEDICATIONS USED:  Nesacaine 1% 20 cc.  SPECIMEN:  Source of Specimen:  Endometrial curettings  DISPOSITION OF SPECIMEN:  PATHOLOGY  COUNTS:  YES  PLAN OF CARE: Transfer to PACU  Genia Del MD  07/28/2012 at 12:31

## 2012-07-28 NOTE — Discharge Summary (Signed)
  Physician Discharge Summary  Patient ID: [name] [name] MRN: [id number] DOB/AGE: 1960/03/21 52 y.o.  Admit date: 07/28/2012 Discharge date: 07/28/2012  Admission Diagnoses: Thick endometrium, Metrorrhagia  78295  Discharge Diagnoses: Thick endometrium, Metrorrhagia  62130        Active Problems:   * No active hospital problems. *   Discharged Condition: good  Hospital Course: Outpatient  Consults: None  Treatments: surgery: D+C, Novasure Endometrial Ablation  Disposition: 01-Home or Self Care     Medication List    TAKE these medications       ibuprofen 200 MG tablet  Commonly known as:  ADVIL,MOTRIN  Take 600 mg by mouth every 8 (eight) hours as needed for pain.     oxyCODONE-acetaminophen 7.5-325 MG per tablet  Commonly known as:  PERCOCET  Take 1 tablet by mouth every 4 (four) hours as needed for pain.     ranitidine 150 MG capsule  Commonly known as:  ZANTAC  Take 150 mg by mouth daily.     tiZANidine 4 MG capsule  Commonly known as:  ZANAFLEX  Take 6 mg by mouth 3 (three) times daily.           Follow-up Information   Follow up with Margarie Mcguirt,MARIE-LYNE, MD In 3 weeks.   Contact information:   [address] [place] [address] [phone]       Signed: Genia Del, MD 07/28/2012, 12:37 PM

## 2012-07-28 NOTE — Transfer of Care (Signed)
Immediate Anesthesia Transfer of Care Note  Patient: [name] [name]  Procedure(s) Performed: Procedure(s): D&C NOVASURE ABLATION (N/A)  Patient Location: PACU  Anesthesia Type:General  Level of Consciousness: awake, alert  and oriented  Airway & Oxygen Therapy: Patient Spontanous Breathing and Patient connected to nasal cannula oxygen  Post-op Assessment: Report given to PACU RN, Post -op Vital signs reviewed and stable and Patient moving all extremities  Post vital signs: Reviewed and stable  Complications: No apparent anesthesia complications

## 2012-07-28 NOTE — Anesthesia Preprocedure Evaluation (Addendum)
Anesthesia Evaluation  Patient identified by MRN, date of birth, ID band Patient awake    Reviewed: Allergy & Precautions, H&P , NPO status , Patient's Chart, lab work & pertinent test results  Airway Mallampati: II TM Distance: >3 FB Neck ROM: Full    Dental no notable dental hx. (+) Teeth Intact and Partial Upper   Pulmonary asthma , Current Smoker,  breath sounds clear to auscultation  Pulmonary exam normal       Cardiovascular negative cardio ROS  Rhythm:Regular Rate:Normal     Neuro/Psych  Headaches, PSYCHIATRIC DISORDERS Depression  Neuromuscular disease    GI/Hepatic negative GI ROS, Neg liver ROS,   Endo/Other  negative endocrine ROS  Renal/GU negative Renal ROS  negative genitourinary   Musculoskeletal   Abdominal Normal abdominal exam  (+)   Peds  Hematology negative hematology ROS (+)   Anesthesia Other Findings   Reproductive/Obstetrics Menometrorrhagia                         Anesthesia Physical Anesthesia Plan  ASA: II  Anesthesia Plan: General   Post-op Pain Management:    Induction: Intravenous  Airway Management Planned: LMA  Additional Equipment:   Intra-op Plan:   Post-operative Plan: Extubation in OR  Informed Consent: I have reviewed the patients History and Physical, chart, labs and discussed the procedure including the risks, benefits and alternatives for the proposed anesthesia with the patient or authorized representative who has indicated his/her understanding and acceptance.   Dental advisory given  Plan Discussed with: CRNA, Anesthesiologist and Surgeon  Anesthesia Plan Comments:         Anesthesia Quick Evaluation

## 2012-07-28 NOTE — Preoperative (Signed)
Beta Blockers   Reason not to administer Beta Blockers:Not Applicable 

## 2012-07-29 ENCOUNTER — Encounter (HOSPITAL_COMMUNITY): Payer: Self-pay | Admitting: Obstetrics & Gynecology

## 2012-08-03 ENCOUNTER — Encounter (HOSPITAL_COMMUNITY): Payer: Self-pay | Admitting: *Deleted

## 2012-08-03 ENCOUNTER — Emergency Department (HOSPITAL_COMMUNITY)
Admission: EM | Admit: 2012-08-03 | Discharge: 2012-08-04 | Disposition: A | Discharge status: Home / Self Care | Payer: Medicare Other | Attending: Emergency Medicine | Admitting: Emergency Medicine

## 2012-08-03 DIAGNOSIS — R197 Diarrhea, unspecified: Secondary | ICD-10-CM

## 2012-08-03 DIAGNOSIS — F17200 Nicotine dependence, unspecified, uncomplicated: Secondary | ICD-10-CM | POA: Insufficient documentation

## 2012-08-03 DIAGNOSIS — Z8679 Personal history of other diseases of the circulatory system: Secondary | ICD-10-CM | POA: Insufficient documentation

## 2012-08-03 DIAGNOSIS — IMO0002 Reserved for concepts with insufficient information to code with codable children: Secondary | ICD-10-CM | POA: Insufficient documentation

## 2012-08-03 DIAGNOSIS — Z3202 Encounter for pregnancy test, result negative: Secondary | ICD-10-CM | POA: Insufficient documentation

## 2012-08-03 DIAGNOSIS — J45909 Unspecified asthma, uncomplicated: Secondary | ICD-10-CM | POA: Insufficient documentation

## 2012-08-03 DIAGNOSIS — Z882 Allergy status to sulfonamides status: Secondary | ICD-10-CM | POA: Insufficient documentation

## 2012-08-03 DIAGNOSIS — Z888 Allergy status to other drugs, medicaments and biological substances status: Secondary | ICD-10-CM | POA: Insufficient documentation

## 2012-08-03 DIAGNOSIS — J309 Allergic rhinitis, unspecified: Secondary | ICD-10-CM | POA: Insufficient documentation

## 2012-08-03 DIAGNOSIS — Z79899 Other long term (current) drug therapy: Secondary | ICD-10-CM | POA: Insufficient documentation

## 2012-08-03 MED ORDER — ONDANSETRON HCL 4 MG/2ML IJ SOLN
4.0000 mg | Freq: Once | INTRAMUSCULAR | Status: DC
  Filled 2012-08-03: qty 2

## 2012-08-03 NOTE — ED Notes (Addendum)
C/o abd pain, nvd and rectal bleeding. Recent endometrial ablation on Monday. nvd onset Thursday. Pain & bleeding onset today. Denies fever. Has been taking advil since ablation. H/o GI bleeding with gastric ulcers last summer.

## 2012-08-04 ENCOUNTER — Emergency Department (HOSPITAL_COMMUNITY): Payer: Medicare Other

## 2012-08-04 ENCOUNTER — Encounter (HOSPITAL_COMMUNITY): Payer: Self-pay | Admitting: Radiology

## 2012-08-04 LAB — URINALYSIS, ROUTINE W REFLEX MICROSCOPIC
Bilirubin Urine: NEGATIVE
Glucose, UA: NEGATIVE mg/dL
Protein, ur: NEGATIVE mg/dL
Specific Gravity, Urine: 1.014 (ref 1.005–1.030)

## 2012-08-04 LAB — COMPREHENSIVE METABOLIC PANEL
ALT: 90 U/L — ABNORMAL HIGH (ref 0–35)
AST: 51 U/L — ABNORMAL HIGH (ref 0–37)
CO2: 24 mEq/L (ref 19–32)
Chloride: 106 mEq/L (ref 96–112)
GFR calc non Af Amer: 55 mL/min — ABNORMAL LOW (ref >90)
Potassium: 3.7 mEq/L (ref 3.5–5.1)
Sodium: 141 mEq/L (ref 135–145)
Total Bilirubin: 0.3 mg/dL (ref 0.3–1.2)

## 2012-08-04 LAB — OCCULT BLOOD, POC DEVICE: Fecal Occult Bld: NEGATIVE

## 2012-08-04 LAB — CBC WITH DIFFERENTIAL/PLATELET
Basophils Relative: 1 % (ref 0–1)
Eosinophils Absolute: 0.2 10*3/uL (ref 0.0–0.7)
Eosinophils Relative: 3 % (ref 0–5)
MCH: 32.7 pg (ref 26.0–34.0)
MCHC: 36.3 g/dL — ABNORMAL HIGH (ref 30.0–36.0)
MCV: 90.1 fL (ref 78.0–100.0)
Monocytes Relative: 11 % (ref 3–12)
Neutrophils Relative %: 48 % (ref 43–77)
Platelets: 234 10*3/uL (ref 150–400)

## 2012-08-04 LAB — URINE MICROSCOPIC-ADD ON

## 2012-08-04 LAB — POCT PREGNANCY, URINE: Preg Test, Ur: NEGATIVE

## 2012-08-04 MED ORDER — FENTANYL CITRATE 0.05 MG/ML IJ SOLN
100.0000 ug | Freq: Once | INTRAMUSCULAR | Status: AC
  Administered 2012-08-04: 100 ug via INTRAVENOUS
  Filled 2012-08-04: qty 2

## 2012-08-04 MED ORDER — IOHEXOL 300 MG/ML  SOLN
100.0000 mL | Freq: Once | INTRAMUSCULAR | Status: AC | PRN
  Administered 2012-08-04: 100 mL via INTRAVENOUS

## 2012-08-04 MED ORDER — SODIUM CHLORIDE 0.9 % IV BOLUS (SEPSIS)
500.0000 mL | Freq: Once | INTRAVENOUS | Status: AC
  Administered 2012-08-04: 500 mL via INTRAVENOUS

## 2012-08-04 MED ORDER — ONDANSETRON HCL 4 MG/2ML IJ SOLN
INTRAMUSCULAR | Status: AC
  Filled 2012-08-04: qty 2

## 2012-08-04 MED ORDER — OXYCODONE-ACETAMINOPHEN 5-325 MG PO TABS: 1.0000 | ORAL_TABLET | Freq: Four times a day (QID) | ORAL | Status: DC | PRN

## 2012-08-04 MED ORDER — ONDANSETRON HCL 4 MG/2ML IJ SOLN
4.0000 mg | Freq: Once | INTRAMUSCULAR | Status: AC
  Administered 2012-08-04: 4 mg via INTRAVENOUS

## 2012-08-04 MED ORDER — IOHEXOL 300 MG/ML  SOLN
50.0000 mL | Freq: Once | INTRAMUSCULAR | Status: AC | PRN
  Administered 2012-08-04: 50 mL via ORAL

## 2012-08-04 NOTE — ED Provider Notes (Signed)
History     CSN: [id number]  Arrival date & time 08/03/12  2331   First MD Initiated Contact with Patient 08/03/12 2349      Chief Complaint  Patient presents with  . Abdominal Pain  . Emesis  . Diarrhea  . Rectal Bleeding    (Consider location/radiation/quality/duration/timing/severity/associated sxs/prior treatment) Patient is a 52 y.o. female presenting with diarrhea. The history is provided by the patient. No language interpreter was used.  Diarrhea Quality:  Watery (has been red, but has only been eating raspberry jello since symptoms started on thursday ) Severity:  Moderate Onset quality:  Gradual Duration:  4 days Timing:  Intermittent Progression:  Unchanged Relieved by:  Nothing Worsened by:  Nothing tried Ineffective treatments:  None tried Associated symptoms: no fever and no vomiting   Risk factors: no recent antibiotic use, no suspicious food intake and no travel to endemic areas     Past Medical History  Diagnosis Date  . ALLERGIC RHINITIS   . Chronic headaches   . DDD (degenerative disc disease)   . Asthma     Past Surgical History  Procedure Laterality Date  . Cesarean section      x3  . Cholecystectomy  08-2009  . Neck/back surgery  1998; 2012  . Novasure ablation N/A 07/28/2012    Procedure: D&C NOVASURE ABLATION;  Surgeon: Genia Del, MD;  Location: [facility];  Service: Gynecology;  Laterality: N/A;    Family History  Problem Relation Age of Onset  . Heart disease Father     non smoker  . Heart disease Paternal Grandfather     unknown if smoker-deceased when pt was 52 yrs old    History  Substance Use Topics  . Smoking status: Current Every Day Smoker -- 1.00 packs/day for 18 years    Types: Cigarettes  . Smokeless tobacco: Never Used  . Alcohol Use: No     Comment: scotch drinker previously-been months without    OB History   Grav Para Term Preterm Abortions TAB SAB Ect Mult Living                  Review of Systems   Constitutional: Negative for fever.  Respiratory: Negative for shortness of breath.   Gastrointestinal: Positive for diarrhea. Negative for vomiting.  All other systems reviewed and are negative.    Allergies  Prednisone and Sulfonamide derivatives  Home Medications   Current Outpatient Rx  Name  Route  Sig  Dispense  Refill  . ranitidine (ZANTAC) 150 MG capsule   Oral   Take 150 mg by mouth daily.          Marland Kitchen tiZANidine (ZANAFLEX) 4 MG capsule   Oral   Take 6 mg by mouth 3 (three) times daily.            BP 148/91  Pulse 82  Temp(Src) 98.5 F (36.9 C) (Oral)  Resp 16  SpO2 100%  Physical Exam  Constitutional: She is oriented to person, place, and time. She appears well-developed and well-nourished. No distress.  HENT:  Head: Normocephalic and atraumatic.  Mouth/Throat: Oropharynx is clear and moist.  Eyes: Conjunctivae are normal. Pupils are equal, round, and reactive to light.  Neck: Normal range of motion. Neck supple.  Cardiovascular: Normal rate, regular rhythm and intact distal pulses.   Pulmonary/Chest: Effort normal and breath sounds normal. She has no wheezes. She has no rales.  Abdominal: Soft. Bowel sounds are normal. There is no tenderness. There is  no rebound and no guarding.  Genitourinary: Guaiac negative stool.  Musculoskeletal: Normal range of motion.  Neurological: She is alert and oriented to person, place, and time.  Skin: Skin is warm and dry.  Psychiatric: She has a normal mood and affect.    ED Course  Procedures (including critical care time)  Labs Reviewed  COMPREHENSIVE METABOLIC PANEL  CBC WITH DIFFERENTIAL  URINALYSIS, ROUTINE W REFLEX MICROSCOPIC  OCCULT BLOOD, POC DEVICE   No results found.   No diagnosis found.    MDM  Suspect red stool related to raspberry jello coloring as patient states she just had red in bowel prior to arrival and stool had reddish tinge on exam but was heme negative.  Follow up with your GYN  for ongoing care.          Jasmine Awe, MD 08/04/12 [phone]

## 2012-08-04 NOTE — ED Notes (Signed)
MD at bedside. 

## 2012-09-29 ENCOUNTER — Encounter (HOSPITAL_COMMUNITY): Payer: Self-pay | Admitting: *Deleted

## 2012-09-29 ENCOUNTER — Encounter (HOSPITAL_COMMUNITY): Payer: Self-pay | Admitting: Emergency Medicine

## 2012-09-29 ENCOUNTER — Emergency Department (HOSPITAL_COMMUNITY)
Admission: EM | Admit: 2012-09-29 | Discharge: 2012-09-29 | Discharge status: Against Medical Advice | Payer: Medicare Other | Attending: Emergency Medicine | Admitting: Emergency Medicine

## 2012-09-29 ENCOUNTER — Emergency Department (HOSPITAL_COMMUNITY)
Admission: EM | Admit: 2012-09-29 | Discharge: 2012-09-29 | Disposition: A | Discharge status: Nursing Home (Includes ICF) | Payer: Medicare Other | Source: Home / Self Care | Attending: Family Medicine | Admitting: Family Medicine

## 2012-09-29 DIAGNOSIS — F17200 Nicotine dependence, unspecified, uncomplicated: Secondary | ICD-10-CM | POA: Insufficient documentation

## 2012-09-29 DIAGNOSIS — M7989 Other specified soft tissue disorders: Secondary | ICD-10-CM | POA: Insufficient documentation

## 2012-09-29 DIAGNOSIS — J45909 Unspecified asthma, uncomplicated: Secondary | ICD-10-CM | POA: Insufficient documentation

## 2012-09-29 NOTE — ED Notes (Signed)
Still unable to locate patient. Went to Vascular lab. Informed that she was transported back to ED lobby at 1443. Went back to lobby. Unable to locate her. Informed triage staff of same.

## 2012-09-29 NOTE — ED Notes (Signed)
Unable to locate patient, was informed that she may be in vascular lab.

## 2012-09-29 NOTE — ED Provider Notes (Signed)
History    CSN: [id number] Arrival date & time 09/29/12  1318  First MD Initiated Contact with Patient 09/29/12 1356     Chief Complaint  Patient presents with  . Leg Pain   (Consider location/radiation/quality/duration/timing/severity/associated sxs/prior Treatment) Patient is a 52 y.o. female presenting with leg pain. The history is provided by the patient.  Leg Pain Location:  Leg Time since incident:  1 day Injury: no   Leg location:  L lower leg Pain details:    Quality:  Dull and pressure (primarily c/o swelling of leg.assoc itching.)   Radiates to:  Does not radiate   Severity:  No pain   Onset quality:  Sudden Chronicity:  New Dislocation: no   Prior injury to area:  No Relieved by:  Nothing Ineffective treatments:  Elevation Risk factors comment:  Smoker and on hrt., no recent travel, had endometrial ablation in may 2014.  Past Medical History  Diagnosis Date  . ALLERGIC RHINITIS   . Chronic headaches   . DDD (degenerative disc disease)   . Asthma    Past Surgical History  Procedure Laterality Date  . Cesarean section      x3  . Cholecystectomy  08-2009  . Neck/back surgery  1998; 2012  . Novasure ablation N/A 07/28/2012    Procedure: D&C NOVASURE ABLATION;  Surgeon: Genia Del, MD;  Location: [facility];  Service: Gynecology;  Laterality: N/A;   Family History  Problem Relation Age of Onset  . Heart disease Father     non smoker  . Heart disease Paternal Grandfather     unknown if smoker-deceased when pt was 52 yrs old   History  Substance Use Topics  . Smoking status: Current Every Day Smoker -- 1.00 packs/day for 18 years    Types: Cigarettes  . Smokeless tobacco: Never Used  . Alcohol Use: No     Comment: scotch drinker previously-been months without   OB History   Grav Para Term Preterm Abortions TAB SAB Ect Mult Living                 Review of Systems  Respiratory: Negative for shortness of breath.   Cardiovascular: Negative for  chest pain and palpitations.  Gastrointestinal: Negative.   Musculoskeletal: Negative.   Skin: Negative.     Allergies  Prednisone and Sulfonamide derivatives  Home Medications   Current Outpatient Rx  Name  Route  Sig  Dispense  Refill  . Estradiol (MINIVELLE TD)   Transdermal   Place onto the skin.         Marland Kitchen HYDROCODONE BITARTRATE ER PO   Oral   Take by mouth.         Marland Kitchen OVER THE COUNTER MEDICATION      progesterone         . oxyCODONE-acetaminophen (PERCOCET) 5-325 MG per tablet   Oral   Take 1 tablet by mouth every 6 (six) hours as needed for pain.   10 tablet   0   . ranitidine (ZANTAC) 150 MG capsule   Oral   Take 150 mg by mouth daily.          Marland Kitchen tiZANidine (ZANAFLEX) 4 MG capsule   Oral   Take 6 mg by mouth 3 (three) times daily.           BP 132/92  Pulse 75  Temp(Src) 98.6 F (37 C) (Oral)  Resp 20  LMP 10/23/2011 Physical Exam  Nursing note and vitals reviewed. Constitutional:  She is oriented to person, place, and time. She appears well-developed and well-nourished.  Cardiovascular: Regular rhythm.   Pulmonary/Chest: Breath sounds normal.  Abdominal: Bowel sounds are normal.  Musculoskeletal: She exhibits edema. She exhibits no tenderness.       Left lower leg: She exhibits swelling and edema. She exhibits no tenderness, no bony tenderness, no deformity and no laceration.       Legs: Neurological: She is alert and oriented to person, place, and time.  Skin: Skin is warm and dry.    ED Course  Procedures (including critical care time) Labs Reviewed - No data to display No results found. 1. Swelling of left lower extremity     MDM  Sent to r/o dvt left leg, smoker ,on hrt and leg swelling did not resolve overnight with elevation.Linna Hoff, MD 09/29/12 [phone]

## 2012-09-29 NOTE — ED Notes (Signed)
Pt transported to [facility] for doppler study

## 2012-09-29 NOTE — Progress Notes (Signed)
*  Preliminary Results* Left lower extremity venous duplex completed. Left lower extremity is negative for deep vein thrombosis. There is no evidence of left Baker's cyst.  09/29/2012 3:26 PM  Gertie Fey, RVT, RDCS, RDMS

## 2012-09-29 NOTE — ED Notes (Signed)
Pt is here with left calf and foot swelling.  Seen at urgent care and referred here to r/o clot.  Pulse present

## 2012-09-29 NOTE — ED Notes (Signed)
Reports left lower leg swelling including foot.  Patient reports itching, numbness, tingling in lower leg and foot and sensations intensify the further down the leg and the closer to the toes you go, sensation intensifies.

## 2012-12-30 ENCOUNTER — Other Ambulatory Visit: Payer: Self-pay | Admitting: Orthopedic Surgery

## 2012-12-30 DIAGNOSIS — M542 Cervicalgia: Secondary | ICD-10-CM

## 2013-01-01 ENCOUNTER — Ambulatory Visit
Admission: RE | Admit: 2013-01-01 | Discharge: 2013-01-01 | Disposition: A | Discharge status: Home / Self Care | Payer: Medicare Other | Source: Ambulatory Visit | Attending: Orthopedic Surgery | Admitting: Orthopedic Surgery

## 2013-01-01 VITALS — BP 117/66 | HR 64

## 2013-01-01 DIAGNOSIS — M542 Cervicalgia: Secondary | ICD-10-CM

## 2013-01-01 DIAGNOSIS — IMO0002 Reserved for concepts with insufficient information to code with codable children: Secondary | ICD-10-CM

## 2013-01-01 MED ORDER — DIAZEPAM 5 MG PO TABS
10.0000 mg | ORAL_TABLET | Freq: Once | ORAL | Status: AC
Start: 2013-01-01 — End: 2013-01-01
  Administered 2013-01-01: 10 mg via ORAL

## 2013-01-01 MED ORDER — OXYCODONE-ACETAMINOPHEN 5-325 MG PO TABS
2.0000 | ORAL_TABLET | Freq: Once | ORAL | Status: AC
  Administered 2013-01-01: 2 via ORAL

## 2013-01-01 MED ORDER — ONDANSETRON HCL 4 MG/2ML IJ SOLN
4.0000 mg | Freq: Once | INTRAMUSCULAR | Status: AC
  Administered 2013-01-01: 4 mg via INTRAMUSCULAR

## 2013-01-01 MED ORDER — MEPERIDINE HCL 100 MG/ML IJ SOLN
100.0000 mg | Freq: Once | INTRAMUSCULAR | Status: AC
  Administered 2013-01-01: 100 mg via INTRAMUSCULAR

## 2013-01-01 MED ORDER — IOHEXOL 300 MG/ML  SOLN
10.0000 mL | Freq: Once | INTRAMUSCULAR | Status: AC | PRN
  Administered 2013-01-01: 10 mL via INTRATHECAL

## 2013-01-01 NOTE — Progress Notes (Signed)
Sam called discharge time.

## 2013-01-05 ENCOUNTER — Other Ambulatory Visit: Payer: Self-pay | Admitting: *Deleted

## 2013-01-05 ENCOUNTER — Ambulatory Visit
Admission: RE | Admit: 2013-01-05 | Discharge: 2013-01-05 | Disposition: A | Discharge status: Home / Self Care | Payer: Medicare Other | Source: Ambulatory Visit | Attending: *Deleted | Admitting: *Deleted

## 2013-01-05 ENCOUNTER — Telehealth: Payer: Self-pay | Admitting: Radiology

## 2013-01-05 DIAGNOSIS — R519 Headache, unspecified: Secondary | ICD-10-CM

## 2013-01-05 DIAGNOSIS — M5000 Cervical disc disorder with myelopathy, unspecified cervical region: Secondary | ICD-10-CM

## 2013-01-05 DIAGNOSIS — G971 Other reaction to spinal and lumbar puncture: Secondary | ICD-10-CM

## 2013-01-05 MED ORDER — IOHEXOL 180 MG/ML  SOLN
1.0000 mL | Freq: Once | INTRAMUSCULAR | Status: AC | PRN
  Administered 2013-01-05: 1 mL via EPIDURAL

## 2013-01-05 NOTE — Progress Notes (Signed)
Blood drawn from right [facility] for blood patch. 20 cc obtained, site unremarkable and pt tolerated procedure well. Discharge instructions explained to pt.

## 2013-01-05 NOTE — Telephone Encounter (Signed)
Pt had myelo this past Thursday. She is still having positional headaches with nausea and vomiting. Explained blood patch procedure and need for written order from pt's office. Pt would like to follow this coarse and is available to come in today. [phone] dr's office called and message for blood patch left with Melissa.

## 2013-01-05 NOTE — Telephone Encounter (Signed)
2nd call to dr. rodriquez's office re: blood patch and spoke with [facility] again. Dr.in surgery and PA is to fax order.

## 2013-11-16 ENCOUNTER — Other Ambulatory Visit: Payer: Self-pay | Admitting: Orthopaedic Surgery

## 2013-11-16 DIAGNOSIS — M25561 Pain in right knee: Secondary | ICD-10-CM

## 2013-11-21 ENCOUNTER — Ambulatory Visit
Admission: RE | Admit: 2013-11-21 | Discharge: 2013-11-21 | Disposition: A | Discharge status: Home / Self Care | Payer: Medicare Other | Source: Ambulatory Visit | Attending: Orthopaedic Surgery | Admitting: Orthopaedic Surgery

## 2013-11-21 DIAGNOSIS — M25561 Pain in right knee: Secondary | ICD-10-CM

## 2015-03-16 ENCOUNTER — Other Ambulatory Visit: Payer: Self-pay | Admitting: Orthopedic Surgery

## 2015-03-16 DIAGNOSIS — M25511 Pain in right shoulder: Secondary | ICD-10-CM

## 2015-03-24 ENCOUNTER — Ambulatory Visit
Admission: RE | Admit: 2015-03-24 | Discharge: 2015-03-24 | Disposition: A | Discharge status: Home / Self Care | Payer: Medicare Other | Source: Ambulatory Visit | Attending: Orthopedic Surgery | Admitting: Orthopedic Surgery

## 2015-03-24 DIAGNOSIS — M25511 Pain in right shoulder: Secondary | ICD-10-CM

## 2015-06-07 DIAGNOSIS — M7580 Other shoulder lesions, unspecified shoulder: Secondary | ICD-10-CM | POA: Insufficient documentation

## 2016-10-17 ENCOUNTER — Other Ambulatory Visit: Payer: Self-pay | Admitting: Orthopedic Surgery

## 2016-10-17 DIAGNOSIS — M4726 Other spondylosis with radiculopathy, lumbar region: Secondary | ICD-10-CM

## 2016-10-20 ENCOUNTER — Ambulatory Visit
Admission: RE | Admit: 2016-10-20 | Discharge: 2016-10-20 | Disposition: A | Discharge status: Home / Self Care | Payer: Medicare Other | Source: Ambulatory Visit | Attending: Orthopedic Surgery | Admitting: Orthopedic Surgery

## 2016-10-20 DIAGNOSIS — M4726 Other spondylosis with radiculopathy, lumbar region: Secondary | ICD-10-CM

## 2016-12-31 ENCOUNTER — Other Ambulatory Visit: Payer: Self-pay | Admitting: Orthopedic Surgery

## 2016-12-31 DIAGNOSIS — M5416 Radiculopathy, lumbar region: Secondary | ICD-10-CM

## 2017-01-02 ENCOUNTER — Ambulatory Visit
Admission: RE | Admit: 2017-01-02 | Discharge: 2017-01-02 | Disposition: A | Discharge status: Home / Self Care | Payer: Medicare Other | Source: Ambulatory Visit | Attending: Orthopedic Surgery | Admitting: Orthopedic Surgery

## 2017-01-02 DIAGNOSIS — M5416 Radiculopathy, lumbar region: Secondary | ICD-10-CM

## 2017-03-17 ENCOUNTER — Encounter (HOSPITAL_COMMUNITY): Payer: Self-pay | Admitting: Emergency Medicine

## 2017-03-17 ENCOUNTER — Emergency Department (HOSPITAL_COMMUNITY): Payer: Medicare Other

## 2017-03-17 ENCOUNTER — Emergency Department (HOSPITAL_COMMUNITY)
Admission: EM | Admit: 2017-03-17 | Discharge: 2017-03-17 | Disposition: A | Discharge status: Home / Self Care | Payer: Medicare Other | Attending: Emergency Medicine | Admitting: Emergency Medicine

## 2017-03-17 ENCOUNTER — Other Ambulatory Visit: Payer: Self-pay

## 2017-03-17 DIAGNOSIS — Z79899 Other long term (current) drug therapy: Secondary | ICD-10-CM | POA: Diagnosis not present

## 2017-03-17 DIAGNOSIS — K2970 Gastritis, unspecified, without bleeding: Secondary | ICD-10-CM | POA: Diagnosis not present

## 2017-03-17 DIAGNOSIS — J45909 Unspecified asthma, uncomplicated: Secondary | ICD-10-CM | POA: Diagnosis not present

## 2017-03-17 DIAGNOSIS — R1010 Upper abdominal pain, unspecified: Secondary | ICD-10-CM

## 2017-03-17 DIAGNOSIS — Z720 Tobacco use: Secondary | ICD-10-CM

## 2017-03-17 DIAGNOSIS — R110 Nausea: Secondary | ICD-10-CM

## 2017-03-17 DIAGNOSIS — Z8711 Personal history of peptic ulcer disease: Secondary | ICD-10-CM

## 2017-03-17 DIAGNOSIS — R079 Chest pain, unspecified: Secondary | ICD-10-CM | POA: Diagnosis present

## 2017-03-17 DIAGNOSIS — Z8719 Personal history of other diseases of the digestive system: Secondary | ICD-10-CM | POA: Diagnosis not present

## 2017-03-17 DIAGNOSIS — F17210 Nicotine dependence, cigarettes, uncomplicated: Secondary | ICD-10-CM | POA: Diagnosis not present

## 2017-03-17 DIAGNOSIS — R0789 Other chest pain: Secondary | ICD-10-CM | POA: Diagnosis not present

## 2017-03-17 LAB — COMPREHENSIVE METABOLIC PANEL
ALT: 35 U/L (ref 14–54)
AST: 29 U/L (ref 15–41)
Albumin: 4.3 g/dL (ref 3.5–5.0)
Alkaline Phosphatase: 104 U/L (ref 38–126)
Anion gap: 8 (ref 5–15)
BUN: 22 mg/dL — ABNORMAL HIGH (ref 6–20)
CO2: 25 mmol/L (ref 22–32)
CREATININE: 1.17 mg/dL — AB (ref 0.44–1.00)
Calcium: 9.3 mg/dL (ref 8.9–10.3)
Chloride: 103 mmol/L (ref 101–111)
GFR calc Af Amer: 60 mL/min — ABNORMAL LOW (ref >60)
GFR, EST NON AFRICAN AMERICAN: 51 mL/min — AB (ref >60)
Glucose, Bld: 101 mg/dL — ABNORMAL HIGH (ref 65–99)
POTASSIUM: 4.1 mmol/L (ref 3.5–5.1)
SODIUM: 136 mmol/L (ref 135–145)
Total Bilirubin: 0.6 mg/dL (ref 0.3–1.2)
Total Protein: 7.3 g/dL (ref 6.5–8.1)

## 2017-03-17 LAB — URINALYSIS, ROUTINE W REFLEX MICROSCOPIC
Bilirubin Urine: NEGATIVE
GLUCOSE, UA: NEGATIVE mg/dL
HGB URINE DIPSTICK: NEGATIVE
Ketones, ur: NEGATIVE mg/dL
LEUKOCYTES UA: NEGATIVE
Nitrite: NEGATIVE
PH: 5 (ref 5.0–8.0)
Protein, ur: NEGATIVE mg/dL
SPECIFIC GRAVITY, URINE: 1.017 (ref 1.005–1.030)

## 2017-03-17 LAB — CBC
HEMATOCRIT: 47.3 % — AB (ref 36.0–46.0)
Hemoglobin: 16 g/dL — ABNORMAL HIGH (ref 12.0–15.0)
MCH: 32.5 pg (ref 26.0–34.0)
MCHC: 33.8 g/dL (ref 30.0–36.0)
MCV: 95.9 fL (ref 78.0–100.0)
Platelets: 253 10*3/uL (ref 150–400)
RBC: 4.93 MIL/uL (ref 3.87–5.11)
RDW: 12.7 % (ref 11.5–15.5)
WBC: 6.9 10*3/uL (ref 4.0–10.5)

## 2017-03-17 LAB — I-STAT TROPONIN, ED: TROPONIN I, POC: 0 ng/mL (ref 0.00–0.08)

## 2017-03-17 LAB — I-STAT BETA HCG BLOOD, ED (MC, WL, AP ONLY): I-stat hCG, quantitative: <5 m[IU]/mL (ref <5)

## 2017-03-17 LAB — LIPASE, BLOOD: Lipase: 35 U/L (ref 11–51)

## 2017-03-17 MED ORDER — RANITIDINE HCL 150 MG PO TABS: 150.0000 mg | ORAL_TABLET | Freq: Two times a day (BID) | ORAL | 0 refills | Status: AC

## 2017-03-17 MED ORDER — ONDANSETRON 4 MG PO TBDP: 4.0000 mg | ORAL_TABLET | Freq: Three times a day (TID) | ORAL | 0 refills | Status: AC | PRN

## 2017-03-17 MED ORDER — ONDANSETRON HCL 4 MG/2ML IJ SOLN
4.0000 mg | Freq: Once | INTRAMUSCULAR | Status: AC
  Administered 2017-03-17: 4 mg via INTRAVENOUS
  Filled 2017-03-17: qty 2

## 2017-03-17 MED ORDER — SODIUM CHLORIDE 0.9 % IV BOLUS (SEPSIS)
1000.0000 mL | Freq: Once | INTRAVENOUS | Status: AC
  Administered 2017-03-17: 1000 mL via INTRAVENOUS

## 2017-03-17 MED ORDER — GI COCKTAIL ~~LOC~~
30.0000 mL | Freq: Once | ORAL | Status: AC
  Administered 2017-03-17: 30 mL via ORAL
  Filled 2017-03-17: qty 30

## 2017-03-17 MED ORDER — FAMOTIDINE IN NACL 20-0.9 MG/50ML-% IV SOLN
20.0000 mg | Freq: Once | INTRAVENOUS | Status: AC
  Administered 2017-03-17: 20 mg via INTRAVENOUS
  Filled 2017-03-17: qty 50

## 2017-03-17 NOTE — ED Notes (Signed)
Patient transported to X-ray 

## 2017-03-17 NOTE — ED Notes (Signed)
Called lab to try to get original troponin from lab from original blood work.  States it's been too long since draw.  Dr. Corlis LeakMackuen made aware

## 2017-03-17 NOTE — Discharge Instructions (Signed)
Your work up today has been reassuring. There are multiple potential causes for your symptoms today, including musculoskeletal pain, gas pain, gastritis, or an ulcer. Continue all of your usual home medications as directed. You will need to take zantac as directed, and avoid spicy/fatty/acidic foods, avoid soda/coffee/tea/alcohol. Avoid laying down flat within 30 minutes of eating. Avoid NSAIDs like ibuprofen/aleve/motrin/etc on an empty stomach. May consider using over the counter tums/maalox as needed for additional relief. Use zofran as directed as needed for nausea. Use tylenol as needed for pain. Use ice or heat to the areas of pain, no more than 20 minutes per hour. STOP SMOKING! Follow up with your regular doctor in 5-7 days for recheck of symptoms. Return to the ER for changes or worsening symptoms.

## 2017-03-17 NOTE — ED Provider Notes (Signed)
[facility] EMERGENCY DEPARTMENT Provider Note   CSN: [id number] Arrival date & time: 03/17/17  7829     History   Chief Complaint Chief Complaint  Patient presents with  . Abdominal Pain  . Nausea    HPI [name] [name] is a 56 y.o. female with a PMHx of chronic headaches, GERD, gastric ulcers, HTN, DDD, chronic neuropathic pain, insomnia, tobacco abuse, and other conditions listed below, and PSHx of cholecystectomy, who presents to the ED with multiple complaints, primarily complaining of nausea and L sided CP that began around 2am this morning while she was at rest in bed, and also L shoulder/arm pain which she feels is separate from the chest pain.  He states that she was sleeping and was awoken by the pain, describing the chest pain is 4/10 intermittent left-sided sharp nonradiating chest pain with no known aggravating factors, unchanged with exertion or inspiration, and with no treatments tried prior to arrival.  She states that her left arm pain starts from her shoulder and goes down her entire arm, she has had similar pain in her right arm when she would sleep on her arm incorrectly, but she has never had any issues with her left arm until today.  She feels that the arm pain is separate from the chest pain since it feels different.  She states that she has associated nausea, and thought that if she could vomit she would feel much better.  She states that she has a dull pain from her throat into her abdomen that feels may be like it could be indigestion.  She attempted to make herself vomit but was unable to.  She admits to being a cigarette smoker.  Positive family history of CABG and MI in her father, and MI in her paternal grandfather at 84 years old.  She also mentions that she had an endoscopy recently by Dr. Randa Evens at [facility], and was told that she did not have any current ulcers however she has had ulcers in the past, and she is on prilosec.  She uses estrogen  patches once weekly.   She denies diaphoresis, lightheadedness, fevers, chills, SOB, LE swelling, recent travel/surgery/immobilization, personal/family hx of DVT/PE, vomiting, diarrhea, constipation, obstipation, melena, hematochezia, hematuria, dysuria, myalgias, claudication, orthopnea, numbness, tingling, focal weakness, or any other complaints at this time.   Of note, chart review reveals that she called her pain specialist on 02/28/17 regarding the fact that her gabapentin dose increase (2400mg /d) seemed to coincide with starting to vomit several times weekly; they instructed her to go back down to her prior dose.  She has since gone back down to her prior dose.    The history is provided by the patient and medical records. No language interpreter was used.  Abdominal Pain   Associated symptoms include nausea and arthralgias. Pertinent negatives include fever, diarrhea, vomiting, constipation, dysuria, hematuria and myalgias.  Chest Pain   This is a new problem. The current episode started 12 to 24 hours ago. Episode frequency: intermittent. The problem has not changed since onset.The pain is associated with rest. The pain is present in the lateral region. The pain is at a severity of 4/10. The pain is mild. The quality of the pain is described as sharp. The pain does not radiate. Duration of episode(s) is 13 hours. Exacerbated by: nothing. Associated symptoms include abdominal pain and nausea. Pertinent negatives include no claudication, no diaphoresis, no fever, no lower extremity edema, no numbness, no orthopnea, no shortness  of breath, no vomiting and no weakness. She has tried nothing for the symptoms. The treatment provided no relief. Risk factors include smoking/tobacco exposure and post-menopausal.  Her past medical history is significant for hypertension.  Her family medical history is significant for CAD.  Pertinent negatives for family medical history include: no PE.    Past Medical  History:  Diagnosis Date  . ALLERGIC RHINITIS   . Asthma   . Chronic headaches   . DDD (degenerative disc disease)     Patient Active Problem List   Diagnosis Date Noted  . Tobacco use disorder 05/06/2011  . Depression 05/06/2011  . 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 Degenerative disk disease 05/06/2011  . INGROWN TOENAIL 11/14/2009  . PATELLO-FEMORAL SYNDROME 11/14/2009  . UNSPECIFIED MYALGIA AND MYOSITIS 11/14/2009    Past Surgical History:  Procedure Laterality Date  . CESAREAN SECTION     x3  . CHOLECYSTECTOMY  08-2009  . neck/back surgery  1998; 2012  . NOVASURE ABLATION N/A 07/28/2012   Procedure: D&C NOVASURE ABLATION;  Surgeon: Genia Del, MD;  Location: [facility];  Service: Gynecology;  Laterality: N/A;    OB History    No data available       Home Medications    Prior to Admission medications   Medication Sig Start Date End Date Taking? Authorizing Provider  Estradiol (MINIVELLE TD) Place onto the skin.    [provider]  HYDROCODONE BITARTRATE ER PO Take by mouth.    [provider]  OVER THE COUNTER MEDICATION progesterone    [provider]  oxyCODONE-acetaminophen (PERCOCET) 5-325 MG per tablet Take 1 tablet by mouth every 6 (six) hours as needed for pain. 08/04/12   Palumbo, April, MD  ranitidine (ZANTAC) 150 MG capsule Take 150 mg by mouth daily.     [provider]  tiZANidine (ZANAFLEX) 4 MG capsule Take 6 mg by mouth 3 (three) times daily.     [provider]    Family History Family History  Problem Relation Age of Onset  . Heart disease Father        non smoker  . Heart disease Paternal Grandfather        unknown if smoker-deceased when pt was 56 yrs old    Social History Social History   Tobacco Use  . Smoking status: Current Every Day Smoker    Packs/day: 1.00    Years: 18.00    Pack years: 18.00    Types: Cigarettes  . Smokeless tobacco: Never Used  Substance Use Topics    . Alcohol use: No    Comment: scotch drinker previously-been months without  . Drug use: No     Allergies   Sulfonamide derivatives and Prednisone   Review of Systems Review of Systems  Constitutional: Negative for chills, diaphoresis and fever.  Respiratory: Negative for shortness of breath.   Cardiovascular: Positive for chest pain. Negative for orthopnea, claudication and leg swelling.  Gastrointestinal: Positive for abdominal pain and nausea. Negative for blood in stool, constipation, diarrhea and vomiting.  Genitourinary: Negative for dysuria and hematuria.  Musculoskeletal: Positive for arthralgias. Negative for myalgias.  Skin: Negative for color change.  Allergic/Immunologic: Negative for immunocompromised state.  Neurological: Negative for weakness, light-headedness and numbness.  Psychiatric/Behavioral: Negative for confusion.   All other systems reviewed and are negative for acute change except as noted in the HPI.    Physical Exam Updated Vital Signs BP (!) 147/88 (BP Location: Right Arm)  Pulse 87   Temp 98.1 F (36.7 C) (Oral)   Resp 17   Ht 5\' 1"  (1.549 m)   Wt 77.1 kg (170 lb)   LMP 10/23/2011   SpO2 97%   BMI 32.12 kg/m   Physical Exam  Constitutional: She is oriented to person, place, and time. Vital signs are normal. She appears well-developed and well-nourished.  Non-toxic appearance. No distress.  Afebrile, nontoxic, NAD  HENT:  Head: Normocephalic and atraumatic.  Mouth/Throat: Oropharynx is clear and moist and mucous membranes are normal.  Eyes: Conjunctivae and EOM are normal. Right eye exhibits no discharge. Left eye exhibits no discharge.  Neck: Normal range of motion. Neck supple.  Cardiovascular: Normal rate, regular rhythm, normal heart sounds and intact distal pulses. Exam reveals no gallop and no friction rub.  No murmur heard. RRR, nl s1/s2, no m/r/g, distal pulses intact, no pedal edema   Pulmonary/Chest: Effort normal and breath  sounds normal. No respiratory distress. She has no decreased breath sounds. She has no wheezes. She has no rhonchi. She has no rales. She exhibits tenderness. She exhibits no crepitus, no deformity and no retraction.  CTAB in all lung fields, no w/r/r, no hypoxia or increased WOB, speaking in full sentences, SpO2 97% on RA Chest wall with mild L sided anterior TTP tracking towards the clavicle/AC joint area, without crepitus, deformities, or retractions     Abdominal: Soft. Normal appearance and bowel sounds are normal. She exhibits no distension. There is tenderness in the right upper quadrant and epigastric area. There is no rigidity, no rebound, no guarding, no CVA tenderness, no tenderness at McBurney's point and negative Murphy's sign.  Soft, nondistended, +BS throughout, with mild epigastric/RUQ TTP, no r/g/r, neg murphy's, neg mcburney's, no CVA TTP   Musculoskeletal: Normal range of motion.  MAE x4 Strength and sensation grossly intact in all extremities Distal pulses intact No pedal edema, neg homan's bilaterally Mild TTP on anterior L upper chest towards the clavicle/AC joint area, but otherwise no focal area of tenderness to remainder of L arm/shoulder. FROM intact in all joints of LUE, no bruising or erythema, no warmth, no crepitus.   Neurological: She is alert and oriented to person, place, and time. She has normal strength. No sensory deficit.  Skin: Skin is warm, dry and intact. No rash noted.  Psychiatric: She has a normal mood and affect.  Nursing note and vitals reviewed.    ED Treatments / Results  Labs (all labs ordered are listed, but only abnormal results are displayed) Labs Reviewed  COMPREHENSIVE METABOLIC PANEL - Abnormal; Notable for the following components:      Result Value   Glucose, Bld 101 (*)    BUN 22 (*)    Creatinine, Ser 1.17 (*)    GFR calc non Af Amer 51 (*)    GFR calc Af Amer 60 (*)    All other components within normal limits  CBC - Abnormal;  Notable for the following components:   Hemoglobin 16.0 (*)    HCT 47.3 (*)    All other components within normal limits  LIPASE, BLOOD  URINALYSIS, ROUTINE W REFLEX MICROSCOPIC  I-STAT BETA HCG BLOOD, ED ([facility], [facility], [facility] ONLY)  I-STAT TROPONIN, ED    EKG  EKG Interpretation  Date/Time:  Sunday March 17 2017 13:19:16 EST Ventricular Rate:  71 PR Interval:  134 QRS Duration: 70 QT Interval:  392 QTC Calculation: 425 R Axis:   28 Text Interpretation:  Normal sinus rhythm Possible  Left atrial enlargement Low voltage QRS Nonspecific ST and T wave abnormality Abnormal ECG Confirmed by Margarita Grizzleay, Danielle [id number]) on 03/17/2017 1:52:08 PM       Radiology Dg Abd Acute W/chest  Result Date: 03/17/2017 CLINICAL DATA:  Abdominal pain and nausea and vomiting for several days. Left-sided chest pain. EXAM: DG ABDOMEN ACUTE W/ 1V CHEST COMPARISON:  Chest radiograph on 06/03/2015 FINDINGS: There is no evidence of dilated bowel loops or free intraperitoneal air. No radiopaque calculi or other significant radiographic abnormality is seen. Right upper quadrant surgical clips from prior cholecystectomy. Heart size and mediastinal contours are within normal limits. Both lungs are clear. Cervical spine fusion hardware again noted. IMPRESSION: Normal bowel gas pattern.  No active cardiopulmonary disease. Electronically Signed   By: Myles RosenthalJohn  Stahl M.D.   On: 03/17/2017 17:16    Procedures Procedures (including critical care time)  Medications Ordered in ED Medications  ondansetron (ZOFRAN) injection 4 mg (4 mg Intravenous Given 03/17/17 1328)  gi cocktail (Maalox,Lidocaine,Donnatal) (30 mLs Oral Given 03/17/17 1546)  famotidine (PEPCID) IVPB 20 mg premix (0 mg Intravenous Stopped 03/17/17 1622)  sodium chloride 0.9 % bolus 1,000 mL (1,000 mLs Intravenous New Bag/Given 03/17/17 1547)  ondansetron (ZOFRAN) injection 4 mg (4 mg Intravenous Given 03/17/17 1720)     Initial Impression / Assessment and Plan / ED  Course  I have reviewed the triage vital signs and the nursing notes.  Pertinent labs & imaging results that were available during my care of the patient were reviewed by me and considered in my medical decision making (see chart for details).     56 y.o. female here with multiple complaints, primarily c/o waking up with nausea and CP as well as L arm pain (which was separate from the chest pain). Also felt like possibly indigestion, c/o pain from her throat into her abdomen. On exam, reproducible chest wall tenderness over L anterior chest and going towards the clavicle/AC joint area, no other area of tenderness to shoulder or L arm, clear lungs, no pedal edema or hypoxia, no tachycardia, mild epigastric/RUQ TTP, nonperitoneal with neg murphy's sign. Work up thus far reveals: trop neg at [facility]11.5hr mark which is very reassuring, doubt need for repeat; betaHCG neg; lipase WNL; CMP with stable marginally bumped kidney function (Cr 1.17, stable from 2014) but otherwise WNL; CBC with slight hemoconcentration considering Hgb 16.0; EKG with low voltage leads and nonspecific ST/T changes but no acute ischemic findings. Symptoms likely musculoskeletal pain and indigestion/gastritis/GERD/PUD related. Will get Acute abd series to evaluate CP and abd pain, give pepcid, GI cocktail, and bolus, then reassess shortly.   6:10 PM Acute abd series negative. U/A unremarkable. Pt feeling much better and tolerating PO well. Reports resolution of symptoms. Overall, symptoms consistent with musculoskeletal pain in combo with GERD/PUD/gastritis. Discussed diet/lifestyle modifications for symptom relief, will start on zantac/zofran, advised tylenol and avoidance/sparing use of NSAIDs only on full stomach, discussed other OTC remedies for symptomatic relief, ice/heat to chest wall, and f/up with PCP in 5-7 days for recheck of symptoms and ongoing evaluation/management. Tobacco cessation strongly encouraged. I explained the diagnosis  and have given explicit precautions to return to the ER including for any other new or worsening symptoms. The patient understands and accepts the medical plan as it's been dictated and I have answered their questions. Discharge instructions concerning home care and prescriptions have been given. The patient is STABLE and is discharged to home in good condition.    Final Clinical Impressions(s) / ED  Diagnoses   Final diagnoses:  Atypical chest pain  Nausea  Gastritis, presence of bleeding unspecified, unspecified chronicity, unspecified gastritis type  Upper abdominal pain  Tobacco user  Hx of gastric ulcer    ED Discharge Orders        Ordered    ranitidine (ZANTAC) 150 MG tablet  2 times daily     03/17/17 1809    ondansetron (ZOFRAN ODT) 4 MG disintegrating tablet  Every 8 hours PRN     03/17/17 [address]1809       Dorrine Montone, NogalesMercedes, New JerseyPA-C 03/17/17 1810    Pricilla LovelessGoldston, Scott, MD 03/18/17 1553

## 2017-03-17 NOTE — ED Triage Notes (Signed)
Pt. Stated, I taker Gabapentin and it makes me throw up. Ive not been able to go over 900 mg.

## 2017-03-17 NOTE — ED Notes (Signed)
Pt reports she woke up at 2am with nausea, left arm pain, and chest pain, sharp mid chest.  Chest pain lasted for several hours.  Pt kept having to go to bathroom thinking she had to vomit.  Pt is having pain from throat to lower abd, dull, uncomfortable.  Pt was able to return to sleep at 7am.  No chest pain at this time.  Pt requesting nausea med,  and still having left arm pain and uncomfortable from throat to lower abd.

## 2017-03-17 NOTE — ED Triage Notes (Signed)
Pt. Stated, I woke up with Nausea and pain in left arm. It feels like acid reflux

## 2017-06-10 ENCOUNTER — Encounter: Payer: Self-pay | Admitting: Obstetrics & Gynecology

## 2017-06-11 ENCOUNTER — Telehealth: Payer: Self-pay | Admitting: *Deleted

## 2017-06-11 MED ORDER — ESTRADIOL 0.075 MG/24HR TD PTWK: 0.0750 mg | MEDICATED_PATCH | TRANSDERMAL | 0 refills | Status: DC

## 2017-06-11 NOTE — Telephone Encounter (Signed)
Pt called requesting refill on Rx estradiol 0.075mg  patch, has annual scheduled on 07/08/17. Last annual per paper chart was on 03/2016

## 2017-06-20 ENCOUNTER — Emergency Department (HOSPITAL_COMMUNITY)
Admission: EM | Admit: 2017-06-20 | Discharge: 2017-06-20 | Disposition: A | Discharge status: Home / Self Care | Payer: Medicare Other | Attending: Emergency Medicine | Admitting: Emergency Medicine

## 2017-06-20 ENCOUNTER — Encounter (HOSPITAL_COMMUNITY): Payer: Self-pay

## 2017-06-20 DIAGNOSIS — R55 Syncope and collapse: Secondary | ICD-10-CM | POA: Insufficient documentation

## 2017-06-20 DIAGNOSIS — Z5321 Procedure and treatment not carried out due to patient leaving prior to being seen by health care provider: Secondary | ICD-10-CM | POA: Insufficient documentation

## 2017-06-20 LAB — COMPREHENSIVE METABOLIC PANEL
ALK PHOS: 83 U/L (ref 38–126)
ALT: 37 U/L (ref 14–54)
ANION GAP: 13 (ref 5–15)
AST: 30 U/L (ref 15–41)
Albumin: 4.8 g/dL (ref 3.5–5.0)
BILIRUBIN TOTAL: 1.1 mg/dL (ref 0.3–1.2)
BUN: 37 mg/dL — AB (ref 6–20)
CALCIUM: 9.4 mg/dL (ref 8.9–10.3)
CO2: 26 mmol/L (ref 22–32)
CREATININE: 1.88 mg/dL — AB (ref 0.44–1.00)
Chloride: 98 mmol/L — ABNORMAL LOW (ref 101–111)
GFR calc Af Amer: 33 mL/min — ABNORMAL LOW (ref >60)
GFR calc non Af Amer: 29 mL/min — ABNORMAL LOW (ref >60)
GLUCOSE: 96 mg/dL (ref 65–99)
Potassium: 4.1 mmol/L (ref 3.5–5.1)
Sodium: 137 mmol/L (ref 135–145)
TOTAL PROTEIN: 7.8 g/dL (ref 6.5–8.1)

## 2017-06-20 LAB — CBC WITH DIFFERENTIAL/PLATELET
BASOS PCT: 0 %
Basophils Absolute: 0 10*3/uL (ref 0.0–0.1)
EOS ABS: 0.1 10*3/uL (ref 0.0–0.7)
EOS PCT: 1 %
HCT: 44.1 % (ref 36.0–46.0)
Hemoglobin: 15 g/dL (ref 12.0–15.0)
Lymphocytes Relative: 29 %
Lymphs Abs: 1.9 10*3/uL (ref 0.7–4.0)
MCH: 31.4 pg (ref 26.0–34.0)
MCHC: 34 g/dL (ref 30.0–36.0)
MCV: 92.5 fL (ref 78.0–100.0)
MONO ABS: 0.6 10*3/uL (ref 0.1–1.0)
Monocytes Relative: 9 %
NEUTROS ABS: 4 10*3/uL (ref 1.7–7.7)
Neutrophils Relative %: 61 %
PLATELETS: 235 10*3/uL (ref 150–400)
RBC: 4.77 MIL/uL (ref 3.87–5.11)
RDW: 13.8 % (ref 11.5–15.5)
WBC: 6.6 10*3/uL (ref 4.0–10.5)

## 2017-06-20 LAB — URINALYSIS, ROUTINE W REFLEX MICROSCOPIC
BILIRUBIN URINE: NEGATIVE
Glucose, UA: NEGATIVE mg/dL
Hgb urine dipstick: NEGATIVE
KETONES UR: NEGATIVE mg/dL
Leukocytes, UA: NEGATIVE
NITRITE: NEGATIVE
PROTEIN: NEGATIVE mg/dL
Specific Gravity, Urine: 1.012 (ref 1.005–1.030)
pH: 5 (ref 5.0–8.0)

## 2017-06-20 LAB — LIPASE, BLOOD: Lipase: 37 U/L (ref 11–51)

## 2017-06-20 MED ORDER — ONDANSETRON 4 MG PO TBDP
4.0000 mg | ORAL_TABLET | Freq: Once | ORAL | Status: AC
  Administered 2017-06-20: 4 mg via ORAL
  Filled 2017-06-20: qty 1

## 2017-06-20 MED ORDER — SODIUM CHLORIDE 0.9 % IV BOLUS: 1000.0000 mL | Freq: Once | INTRAVENOUS | Status: DC

## 2017-06-20 NOTE — ED Triage Notes (Signed)
Pt presents with onset of nausea, vomiting and diarrhea that began at 0300 today.  Pt reports epigastric pain. Pt reports having 3 syncopal episodes today with incontinence.

## 2017-06-20 NOTE — ED Provider Notes (Signed)
MSE was initiated and I personally evaluated the patient and placed orders (if any) at  6:37 PM on June 20, 2017.  The patient appears stable so that the remainder of the MSE may be completed by another provider.   [age] year old female with a  h/o of DDD who presents to the emergency department with a chief complaint of syncope.    The patient endorses 3-4 episodes of syncope that began at 3 AM last night. She reports that she awoke from sleep and needed to use the restroom. She reports that she got out of bed, felt a wave of dizziness and passed out onto her carpeted floor. She was laying on her back with her dog licking her face when she came to. The remaining episodes presented the same way.   She reports that she has had 2-3 other syncopal events since around December 2018. She reports that the all present about the same way: she gets out of bed, becomes dizzy and has a syncopal episode. Each event has preceded sudden onset of N/V/D and abdominal pain.   She endorses 3-4 episodes of NBNB emesis, intermittent nausea, and multiple episodes of nonbloody diarrhea. She also had abdominal pain that she states "goes from my throat straight down to my pelvis" that comes and goes.   She also notes pain that radiates down her left arm while she was laying down in bed between the syncopal episodes. She has a h/o of right shoulder biceps tenodesis surgery in 2017, but no previous surgery or injuries to the left shoulder.   She denies fever, chills, chest pain, dyspnea, palpitations, headache, visual changes.  Recent medication changes including lisinopril/HCTZ in December 2018, around the time the first syncopal episode began. She takes her daily dose in the AM. Last dose was yesterday morning. Her gabapentin was also increased (2400 mg/d) on 03/01/17, but decreased her dose to her previous dose due to vomiting several times weekly.    She is followed by [facility] for a h/o of ulcers. Last  endoscopy was unremarkable. Last colonoscopy with several polyps and she was advised to have a repeat colonoscopy in 2-3 years. She had an endometrial ablation in 2014.   Family hx include cardiovascular disease, father.   On exam, Patient is well appearing. NAD. HR is RRR, No M/R/G. Lungs are CTAB.  Abdomen is soft, nondistended.  No reproducible tenderness.  She has mild left CVA tenderness.  No right CVA tenderness.   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     Plan: Orthostatic vital signs and peripheral IV ordered. She has a new AKI with Cr of 1.88. Baseline Cr ~1.1. 1 L IV fluid bolus has been ordered.   Was approached by nursing staff because the patient was declining EKG and was requesting to leave after fluids.  After lengthy discussion, the patient is agreeable to staying and completing her work up.  1 L IV fluid bolus has been ordered.      Frederik PearMcDonald, Muaad Boehning A, PA-C 06/20/17 1837    Rolan BuccoBelfi, Melanie, MD 07/02/17 [phone]

## 2017-07-08 ENCOUNTER — Encounter: Payer: Self-pay | Admitting: Obstetrics & Gynecology

## 2017-07-25 ENCOUNTER — Other Ambulatory Visit: Payer: Self-pay | Admitting: Orthopedic Surgery

## 2017-07-25 DIAGNOSIS — M4726 Other spondylosis with radiculopathy, lumbar region: Secondary | ICD-10-CM

## 2017-08-03 ENCOUNTER — Ambulatory Visit
Admission: RE | Admit: 2017-08-03 | Discharge: 2017-08-03 | Disposition: A | Discharge status: Home / Self Care | Payer: Medicare Other | Source: Ambulatory Visit | Attending: Orthopedic Surgery | Admitting: Orthopedic Surgery

## 2017-08-03 DIAGNOSIS — M4726 Other spondylosis with radiculopathy, lumbar region: Secondary | ICD-10-CM

## 2017-08-11 ENCOUNTER — Other Ambulatory Visit: Payer: Self-pay | Admitting: Obstetrics & Gynecology

## 2017-09-16 DIAGNOSIS — E7801 Familial hypercholesterolemia: Secondary | ICD-10-CM | POA: Diagnosis present

## 2017-09-16 DIAGNOSIS — I10 Essential (primary) hypertension: Secondary | ICD-10-CM | POA: Insufficient documentation

## 2017-09-16 DIAGNOSIS — E78019 Familial hypercholesterolemia, unspecified: Secondary | ICD-10-CM | POA: Diagnosis present

## 2017-09-16 DIAGNOSIS — E669 Obesity, unspecified: Secondary | ICD-10-CM | POA: Insufficient documentation

## 2017-09-16 DIAGNOSIS — F17219 Nicotine dependence, cigarettes, with unspecified nicotine-induced disorders: Secondary | ICD-10-CM | POA: Insufficient documentation

## 2017-09-19 ENCOUNTER — Other Ambulatory Visit: Payer: Self-pay | Admitting: Obstetrics & Gynecology

## 2017-09-20 NOTE — Telephone Encounter (Signed)
Annual now scheduled on 12/06/17

## 2017-10-24 DIAGNOSIS — K219 Gastro-esophageal reflux disease without esophagitis: Secondary | ICD-10-CM | POA: Insufficient documentation

## 2017-11-14 ENCOUNTER — Other Ambulatory Visit: Payer: Self-pay | Admitting: Obstetrics & Gynecology

## 2017-12-06 ENCOUNTER — Encounter: Payer: Self-pay | Admitting: Obstetrics & Gynecology

## 2017-12-20 DIAGNOSIS — Z9889 Other specified postprocedural states: Secondary | ICD-10-CM | POA: Insufficient documentation

## 2018-01-05 ENCOUNTER — Other Ambulatory Visit: Payer: Self-pay | Admitting: Obstetrics & Gynecology

## 2018-05-20 ENCOUNTER — Encounter: Payer: Self-pay | Admitting: Gastroenterology

## 2018-06-03 ENCOUNTER — Telehealth: Payer: Self-pay

## 2018-06-03 NOTE — Telephone Encounter (Signed)
Covid-19 travel screening questions  Have you traveled in the last 14 days? If yes where?  Do you now or have you had a fever in the last 14 days?  Do you have any respiratory symptoms of shortness of breath or cough now or in the last 14 days?  Do you have a medical history of Congestive Heart Failure?  Do you have a medical history of lung disease?  Do you have any family members or close contacts with diagnosed or suspected Covid-19?       I was not able to reach the patient.  I left a detailed message asking if they meet any of the criteria to please call the office at [phone] prior to coming to the appointment.   

## 2018-06-04 ENCOUNTER — Ambulatory Visit: Payer: Medicare Other | Admitting: Gastroenterology

## 2018-07-29 ENCOUNTER — Other Ambulatory Visit: Payer: Self-pay

## 2018-07-29 ENCOUNTER — Encounter: Payer: Self-pay | Admitting: Gastroenterology

## 2018-07-29 ENCOUNTER — Telehealth: Payer: Medicare Other | Admitting: Gastroenterology

## 2018-09-29 DIAGNOSIS — R55 Syncope and collapse: Secondary | ICD-10-CM | POA: Insufficient documentation

## 2018-09-30 DIAGNOSIS — G8929 Other chronic pain: Secondary | ICD-10-CM | POA: Diagnosis present

## 2018-10-09 ENCOUNTER — Encounter: Payer: Self-pay | Admitting: Psychology

## 2018-12-05 DIAGNOSIS — E559 Vitamin D deficiency, unspecified: Secondary | ICD-10-CM | POA: Insufficient documentation

## 2018-12-16 ENCOUNTER — Encounter: Payer: Medicare Other | Attending: Psychology | Admitting: Psychology

## 2018-12-16 ENCOUNTER — Encounter: Payer: Self-pay | Admitting: Psychology

## 2018-12-16 ENCOUNTER — Other Ambulatory Visit: Payer: Self-pay

## 2018-12-16 DIAGNOSIS — M549 Dorsalgia, unspecified: Secondary | ICD-10-CM | POA: Diagnosis not present

## 2018-12-16 DIAGNOSIS — G894 Chronic pain syndrome: Secondary | ICD-10-CM | POA: Diagnosis not present

## 2018-12-16 DIAGNOSIS — E785 Hyperlipidemia, unspecified: Secondary | ICD-10-CM | POA: Insufficient documentation

## 2018-12-16 DIAGNOSIS — J45909 Unspecified asthma, uncomplicated: Secondary | ICD-10-CM | POA: Insufficient documentation

## 2018-12-16 DIAGNOSIS — M542 Cervicalgia: Secondary | ICD-10-CM | POA: Diagnosis not present

## 2018-12-16 NOTE — Progress Notes (Signed)
Neuropsychological Consultation   Patient:   [name] [name]   DOB:   10-May-1960  MR Number:  [id number]  [facility]  [facility] [address] [id number] [facility] [place] [id number] Dept: [phone]           Date of Service:   12/16/2018  Start Time:   10 AM End Time:   11:30 AM today's visit was a 1/2-hour visit that was conducted in my outpatient clinic office.  This was an in person visit with myself and the patient present.  1 hour was spent in the face-to-face portion with a half an hour spent with report writing and review of available medical records.  Provider/Observer:  Arley PhenixJohn Rodenbough, Psy.D.       Clinical Neuropsychologist       Billing Code/Service: Diagnostic clinical interview  Reason for Service:  [name] [name] is a 58 year old female referred by Letta Kocherhomas Saullo, MD for psychological evaluation as part of the standard protocol/work-up for spinal cord stimulator trialing and possible implantation.  The patient has a history of 3 prior surgeries on her neck with the first surgery in the late 1990s.  The patient reports that while the surgeries have helped her she continues to have significant pain from the degenerative disc and surgeries in her neck.  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  The patient [facility] and while they were able to successfully remove the bone tumor she has residual significant chronic pain from this medical issue.  The patient describes continued back pain and neck pain.  Other medical issues include degenerative disc disease, hyperlipidemia, chronic headaches, asthma and allergic rhinitis.  The patient has had times in the past with depression primarily triggered by psychosocial stressors as well as insomnia associated with that depression.  The patient denies  any acute or severe depression at this time but does describe some bereavement that is being adjusted to after the recent passing of her mother due to Alzheimer's dementia.  The patient denies any significant depressive, anxiety or psychosocial stressors beyond coping with the expected passing of her mother from Alzheimer's dementia.  The patient has had a prior history of some postpartum depression.  The patient reports that the biggest stressor is constantly dealing with the chronic pain.  The patient reports that she was able to return to work with the moderate improvement from her pain from her previous neck surgeries but much of this is because of her employment situation where they help her manage and cope with any difficulties.  The patient reports that her sleep pattern is somewhat disturbed due to waking up with pain.  The patient describes nausea and diarrhea from her pain and medications for pain that affect her appetite.  Memory is described as being good.  Current Status:  The patient describes chronic pain in her back and neck from previous surgeries and medical issues that required surgeries.  Reliability of Information: The information is derived from 1 hour face-to-face clinical interview with the patient as well as review of available medical records.  Behavioral Observation: [name] [name]  presents as a 58 y.o.-year-old Right Caucasian Female who appeared her stated age. her dress was Appropriate and she was Well Groomed and her manners were Appropriate to the situation.  her participation was indicative of Appropriate and Attentive behaviors.  There  were physical disabilities noted due to residual pain symptoms.  she displayed an appropriate level of cooperation and motivation.     Interactions:    Active Appropriate and Attentive  Attention:   within normal limits and attention span and concentration were age appropriate  Memory:   within normal limits; recent and remote memory  intact  Visuo-spatial:  within normal limits  Speech (Volume):  normal  Speech:   normal; normal  Thought Process:  Coherent and Relevant  Though Content:  WNL; not suicidal and not homicidal  Orientation:   person, place, time/date and situation  Judgment:   Good  Planning:   Good  Affect:    Appropriate  Mood:    Euthymic  Insight:   Good  Intelligence:   very high  Marital Status/Living: The patient was born and raised in [place] with 2 siblings.  She currently lives alone.  The patient is divorced and has had 3 previous marriages.  1 marriage lasted for 2 years, another one was 5 years and the other marriage was 10 years.  The patient has a 45 year old son, 83 year old son and an 65 year old daughter.  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  Hobbies and interests include walking, hiking, cycling and martial arts although these are significantly affected by her chronic pain condition.  Substance Use:  The patient describes occasional alcohol use as well as tobacco use.  She denies any other substance use.  Education:   The patient graduated from college and has 2 masters degree.  The patient attended college of [employer], [place].  Her best subject was always in math and she has worked as a Engineer, maintenance (IT) for many years.  Extracurricular activities included cheerleading, Furniture conservator/restorer in various clubs in high school.  Medical History:   Past Medical History:  Diagnosis Date  . ALLERGIC RHINITIS   . Asthma   . Chronic headaches   . DDD (degenerative disc disease)   . Hyperlipidemia    Psychiatric History:  The patient has a history of postpartum depression some events with depression/bereavement more recently with the death of her mother from Alzheimer's dementia.  The patient denies any significant or severe depressive  symptoms or anxiety based symptoms at this time.  The no significant psychiatric history is noted.  Family Med/Psych History:  Family History  Problem Relation Age of Onset  . Heart disease Father        non smoker  . Heart disease Paternal Grandfather        unknown if smoker-deceased when pt was 58 yrs old    Risk of Suicide/Violence: virtually non-existent the patient denies any suicidal or homicidal ideation.  Impression/DX:  [name] [name] is a 58 year old female referred by Zonia Kief, MD for psychological evaluation as part of the standard protocol/work-up for spinal cord stimulator trialing and possible implantation.  The patient has a history of 3 prior surgeries on her neck with the first surgery in the late 1990s.  The patient reports that while the surgeries have helped her she continues to have significant pain from the degenerative disc and surgeries in her neck.  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  The patient had her surgery performed at [facility] and while they were able to successfully remove the bone tumor she  has residual significant chronic pain from this medical issue.  The patient describes continued back pain and neck pain.  Other medical issues include degenerative disc disease, hyperlipidemia, chronic headaches, asthma and allergic rhinitis.  The patient has had times in the past with depression primarily triggered by psychosocial stressors as well as insomnia associated with that depression.  The patient denies any acute or severe depression at this time but does describe some bereavement that is being adjusted to after the recent passing of her mother due to Alzheimer's dementia.  The patient denies any significant depressive, anxiety or psychosocial stressors beyond coping with the expected passing of her mother from Alzheimer's dementia.  The patient has had a prior history of some postpartum depression.   The patient reports that the biggest stressor is constantly dealing with the chronic pain.  The patient reports that she was able to return to work with the moderate improvement from her pain from her previous neck surgeries but much of this is because of her employment situation where they help her manage and cope with any difficulties.  The patient reports that her sleep pattern is somewhat disturbed due to waking up with pain.  The patient describes nausea and diarrhea from her pain and medications for pain that affect her appetite.  Memory is described as being good.  The patient describes chronic pain in her back and neck from previous surgeries and medical issues that required surgeries.   Disposition/Plan:  The patient will complete the [place] multiphasic personality inventory-2 as well as the pain patient profile (P3).  When she is completed those objective psychological measures then will be combined with clinical data and a formal psychological evaluation/report will be completed.  This information will be provided to her referring physician as part of this work-up for consideration of spinal cord stimulator trialing and possible implantation.  I will also make the report available to the patient to be able to access through my chart.  If the patient has any questions or would like feedback are complete review of this information with her and in person setting I will be happy to do so and the patient will be able to call and schedule appointment for that.  Diagnosis:    Chronic pain syndrome         Electronically Signed   _______________________ Arley Phenix, Psy.D.

## 2019-01-15 ENCOUNTER — Encounter: Payer: Self-pay | Admitting: Psychology

## 2019-01-15 ENCOUNTER — Encounter: Payer: Medicare Other | Attending: Psychology | Admitting: Psychology

## 2019-01-15 ENCOUNTER — Other Ambulatory Visit: Payer: Self-pay

## 2019-01-15 DIAGNOSIS — E785 Hyperlipidemia, unspecified: Secondary | ICD-10-CM | POA: Diagnosis not present

## 2019-01-15 DIAGNOSIS — J45909 Unspecified asthma, uncomplicated: Secondary | ICD-10-CM | POA: Diagnosis not present

## 2019-01-15 DIAGNOSIS — G894 Chronic pain syndrome: Secondary | ICD-10-CM | POA: Diagnosis not present

## 2019-01-15 DIAGNOSIS — M542 Cervicalgia: Secondary | ICD-10-CM | POA: Insufficient documentation

## 2019-01-15 DIAGNOSIS — M549 Dorsalgia, unspecified: Secondary | ICD-10-CM | POA: Diagnosis not present

## 2019-01-15 NOTE — Progress Notes (Signed)
Neuropsychological Evaluation   Patient:                           [name] [name]               DOB:                               1961/03/19   MR Number:                  [id number]   Location:                        [facility] FOR PAIN AND [facility] MEDICINE [facility] PHYSICAL MEDICINE AND REHABILITATION [address] [place], [address] [id number] [facility] [place] [address] Dept: [phone]   Date of Service:                     04/16/2019   Start Time:                               8 AM End Time:                                9 AM   Provider/Observer:               Arley Phenix, Psy.D.                                                   Clinical Neuropsychologist  Today's visit included the patient completing the [place] multiphasic personality inventory-II as well as the pain patient profile.  These objective psychological/neuropsychological test measures were then scored and interpreted with formal report writing today.   Reason for Service:              [name] [name] is a 58 year old female referred by Letta Kocher, MD for psychological evaluation as part of the standard protocol/work-up for spinal cord stimulator trialing and possible implantation.  The patient has a history of 3 prior surgeries on her neck with the first surgery in the late 1990s.  The patient reports that while the surgeries have helped her she continues to have significant pain from the degenerative disc and surgeries in her neck.  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  The patient had her surgery performed at [facility] and while they were able to successfully remove the bone tumor she has residual significant chronic pain from this medical issue.  The patient describes continued back pain and neck pain.  Other medical issues include degenerative disc disease, hyperlipidemia, chronic headaches, asthma and allergic rhinitis.  The patient  has had times in the past with depression primarily triggered by psychosocial stressors as well as insomnia associated with that depression.  The patient denies any acute or severe depression at this time but does describe some bereavement that is being adjusted to after the recent passing of her mother due to Alzheimer's dementia.   The patient denies any significant depressive, anxiety or psychosocial stressors beyond coping with the expected  passing of her mother from Alzheimer's dementia.  The patient has had a prior history of some postpartum depression.  The patient reports that the biggest stressor is constantly dealing with the chronic pain.  The patient reports that she was able to return to work with the moderate improvement from her pain from her previous neck surgeries but much of this is because of her employment situation where they help her manage and cope with any difficulties.  The patient reports that her sleep pattern is somewhat disturbed due to waking up with pain.  The patient describes nausea and diarrhea from her pain and medications for pain that affect her appetite.  Memory is described as being good.  Testing Administered:              The patient was administered and completed the [place] multiphasic personality inventory and the pain patient profile (P3).   Participation Level:                           Active   Participation Quality:                       Appropriate and Attentive                            Behavioral Observation:                  Well Groomed, Alert, and Appropriate.    Test Results:    Initially, the patient completed the [place] multiphasic personality inventory.  The resulting validity scales suggest that the patient approached this measure in an honest and straightforward manner neither attempting to exaggerate or minimize her current symptomatology.    The resulting basic/clinical scales showed no clinical elevations in any measures other than  clinical scales related to preoccupation with physical symptoms specific somatic complaints, and beliefs about one's physical competence (Scale 1, T=92; Scale 3, T=96). The elevations on these scales appear to be specifically related to her chronic pain symptoms and not the presence of somatic delusions or functional neurological disorder such as conversion. No other basic/clinical scales were clinically elevated and there were no indications of significant depression, anxiety, anger, manic or other bipolar type symptoms, psychosis or other more significant or serious psychiatric issues.  Further analysis utilizing content and supplemental scales do show some mild elevations with regard to health concerns which are consistent with her medical status.  The patient has no significant elevation in either of the posttraumatic stress disorder scales and does not have a clinical sleep significant elevation on the McAndrews scale that has been shown to be sensitive to vulnerabilities to alcohol or other substance abuse.  The patient does appear to have good social skills and knowledge and does not have difficulties with significant interpersonal relationships. While the patient described intermittent periods of bereavement due to her mother's recent passing from Alzheimer's disease, there is no social withdrawal or other vulnerabilities that would leave her isolated in life.   The patient then completed the pain patient profile (P3).  This measure is not only normed on a normative/community-based sample but also is normed on a sample of patients with chronic pain symptoms without psychiatric issues.  This allows for further breakdown of issues such as depression anxiety that can be elevated on the MMPI that are more related to chronic pain symptoms rather than psychiatric issues.  The patient produced a valid profile with no indication that she is either exaggerating or minimizing her current symptomatology on this  measure either.  The patient's response pattern suggests she is less depressed (T=42) and anxious (T=36) than the average chronic pain patient (e.g. without significant psychological/psychiatric symptomatology).   The patient's score on the Somatization scale (T=60) is above average for a pain patient. Her score suggests she is troubled by her physical problems, pain, and health related issues that are having a negative effect on her life. The patient's pain and suffering may occupy a distressed amount of her attention and concentration, likely causing her to be easily distracted in her daily life. Individuals with a clearly defined organic basis for pain often respond in this manner. Although she is cognitively and emotionally distressed by her physical symptoms, her score suggests she has the ability to actively participate in a treatment plan for pain relief without major interference from excessive somatic thought.    Summary of Results:                        Overall, the results of the current psychological/neuropsychological evaluation do not indicate any significant psychological/psychiatric or psychosocial issues outside of a community/normative sample.  The patient does have a mild elevation on somatic symptoms but these are almost exclusively related to physical difficulties and pain. The patient also shows no indication of any significant cognitive deficits or impairments. While the patient described intermittent periods of bereavement due to her mother's recent passing from Alzheimer's disease, she denied any  significant depressive, anxiety or psychosocial stressors that would leave her isolated in life.  Overall, there are no indications of psychological or psychiatric issues that would have a deleterious effect on either the trialing phase or post implantation phase.    Impression/Diagnosis:                     Overall, the results of the current psychological/neuropsychological evaluation  do strongly suggest that the patient is an excellent candidate from a psychological/psychiatric perspective for spinal cord stimulator trialing and possible implantation.    During the clinical interview and review of available medical records there were no indications of any significant psychosocial stressors that would complicate her ability to accurately interpret and understand any results that she would experience during the spinal cord stimulator trialing phase.  The patient shows no indication of cognitive difficulties that would limit her ability to follow through with any postoperative recommendations.  There are also no indications on objective psychological/neuropsychological measures that would suggest any clinically significant levels of depression, anxiety, mood disturbance, or more significant psychiatric issues.  Again, the patient does appear to be an excellent candidate for spinal cord stimulator trialing and possible implantation.   Diagnosis:                                Chronic pain syndrome     Arley PhenixJohn Rodenbough, Psy.D. Neuropsychologist

## 2019-02-06 DIAGNOSIS — F411 Generalized anxiety disorder: Secondary | ICD-10-CM | POA: Diagnosis present

## 2019-03-06 ENCOUNTER — Other Ambulatory Visit: Payer: Self-pay | Admitting: Orthopaedic Surgery

## 2019-03-06 DIAGNOSIS — M546 Pain in thoracic spine: Secondary | ICD-10-CM

## 2019-03-10 ENCOUNTER — Other Ambulatory Visit: Payer: Medicare Other

## 2019-04-02 ENCOUNTER — Ambulatory Visit
Admission: RE | Admit: 2019-04-02 | Discharge: 2019-04-02 | Disposition: A | Discharge status: Home / Self Care | Payer: Medicare Other | Source: Ambulatory Visit | Attending: Orthopaedic Surgery | Admitting: Orthopaedic Surgery

## 2019-04-02 DIAGNOSIS — M546 Pain in thoracic spine: Secondary | ICD-10-CM

## 2019-04-16 ENCOUNTER — Ambulatory Visit: Payer: Medicare Other | Admitting: Psychology

## 2020-10-08 ENCOUNTER — Other Ambulatory Visit: Payer: Self-pay | Admitting: Orthopaedic Surgery

## 2020-10-08 DIAGNOSIS — M4716 Other spondylosis with myelopathy, lumbar region: Secondary | ICD-10-CM

## 2020-10-08 DIAGNOSIS — M5416 Radiculopathy, lumbar region: Secondary | ICD-10-CM

## 2020-12-23 ENCOUNTER — Other Ambulatory Visit: Payer: Self-pay | Admitting: Rehabilitation

## 2020-12-23 DIAGNOSIS — M4716 Other spondylosis with myelopathy, lumbar region: Secondary | ICD-10-CM

## 2021-01-05 ENCOUNTER — Inpatient Hospital Stay
Admission: RE | Admit: 2021-01-05 | Discharge: 2021-01-05 | Disposition: A | Discharge status: Home / Self Care | Payer: Medicare Other | Source: Ambulatory Visit | Attending: Rehabilitation | Admitting: Rehabilitation

## 2021-01-05 ENCOUNTER — Ambulatory Visit
Admission: RE | Admit: 2021-01-05 | Discharge: 2021-01-05 | Disposition: A | Discharge status: Home / Self Care | Payer: Medicare Other | Source: Ambulatory Visit | Attending: Rehabilitation | Admitting: Rehabilitation

## 2021-01-05 ENCOUNTER — Other Ambulatory Visit: Payer: Medicare Other

## 2021-01-05 ENCOUNTER — Other Ambulatory Visit: Payer: Self-pay

## 2021-01-05 DIAGNOSIS — M4716 Other spondylosis with myelopathy, lumbar region: Secondary | ICD-10-CM

## 2021-01-05 MED ORDER — DIAZEPAM 5 MG PO TABS: 10.0000 mg | ORAL_TABLET | Freq: Once | ORAL | Status: DC

## 2021-01-05 MED ORDER — DIAZEPAM 5 MG PO TABS
10.0000 mg | ORAL_TABLET | Freq: Once | ORAL | Status: AC
  Administered 2021-01-05: 10 mg via ORAL

## 2021-01-05 MED ORDER — IOPAMIDOL (ISOVUE-M 200) INJECTION 41%
18.0000 mL | Freq: Once | INTRAMUSCULAR | Status: AC
  Administered 2021-01-05: 18 mL via INTRATHECAL

## 2021-01-05 MED ORDER — MEPERIDINE HCL 50 MG/ML IJ SOLN: 50.0000 mg | Freq: Once | INTRAMUSCULAR | Status: DC | PRN

## 2021-01-05 MED ORDER — ONDANSETRON HCL 4 MG/2ML IJ SOLN: 4.0000 mg | Freq: Once | INTRAMUSCULAR | Status: DC | PRN

## 2021-01-05 NOTE — Discharge Instructions (Signed)

## 2021-01-05 NOTE — Progress Notes (Signed)
Patient has turned off her SCS.

## 2021-02-08 ENCOUNTER — Other Ambulatory Visit: Payer: Self-pay | Admitting: Orthopaedic Surgery

## 2021-02-08 DIAGNOSIS — R102 Pelvic and perineal pain: Secondary | ICD-10-CM

## 2021-03-16 ENCOUNTER — Inpatient Hospital Stay: Admission: RE | Admit: 2021-03-16 | Payer: Medicare Other | Source: Ambulatory Visit

## 2021-07-27 ENCOUNTER — Other Ambulatory Visit: Payer: Self-pay | Admitting: Orthopaedic Surgery

## 2021-07-27 DIAGNOSIS — M4326 Fusion of spine, lumbar region: Secondary | ICD-10-CM

## 2021-08-11 ENCOUNTER — Inpatient Hospital Stay: Admission: RE | Admit: 2021-08-11 | Payer: Medicare Other | Source: Ambulatory Visit

## 2021-08-11 ENCOUNTER — Other Ambulatory Visit: Payer: Medicare Other

## 2021-08-17 ENCOUNTER — Ambulatory Visit
Admission: RE | Admit: 2021-08-17 | Discharge: 2021-08-17 | Disposition: A | Discharge status: Home / Self Care | Payer: Medicare Other | Source: Ambulatory Visit | Attending: Orthopaedic Surgery | Admitting: Orthopaedic Surgery

## 2021-08-17 ENCOUNTER — Other Ambulatory Visit: Payer: Self-pay | Admitting: Orthopaedic Surgery

## 2021-08-17 DIAGNOSIS — M4326 Fusion of spine, lumbar region: Secondary | ICD-10-CM

## 2021-08-17 MED ORDER — ONDANSETRON HCL 4 MG/2ML IJ SOLN
4.0000 mg | Freq: Once | INTRAMUSCULAR | Status: AC | PRN
  Administered 2021-08-17: 4 mg via INTRAMUSCULAR

## 2021-08-17 MED ORDER — IOPAMIDOL (ISOVUE-M 200) INJECTION 41%
18.0000 mL | Freq: Once | INTRAMUSCULAR | Status: AC
  Administered 2021-08-17: 18 mL via INTRATHECAL

## 2021-08-17 MED ORDER — MEPERIDINE HCL 50 MG/ML IJ SOLN
50.0000 mg | Freq: Once | INTRAMUSCULAR | Status: AC | PRN
  Administered 2021-08-17: 50 mg via INTRAMUSCULAR

## 2021-08-17 MED ORDER — DIAZEPAM 5 MG PO TABS
10.0000 mg | ORAL_TABLET | Freq: Once | ORAL | Status: AC
  Administered 2021-08-17: 10 mg via ORAL

## 2021-08-17 NOTE — Discharge Instructions (Signed)

## 2021-08-17 NOTE — Discharge Instr - Other Orders (Signed)
1005: Pt 9/10 post myelogram procedure, see MAR.

## 2021-08-17 NOTE — Progress Notes (Signed)
Pt reports her spinal cord stimulator is off for myelogram procedure.

## 2022-01-18 ENCOUNTER — Other Ambulatory Visit: Payer: Self-pay | Admitting: Student in an Organized Health Care Education/Training Program

## 2022-01-18 DIAGNOSIS — M25551 Pain in right hip: Secondary | ICD-10-CM

## 2022-03-06 ENCOUNTER — Other Ambulatory Visit: Payer: Medicare Other

## 2022-03-26 ENCOUNTER — Other Ambulatory Visit: Payer: Self-pay | Admitting: Student in an Organized Health Care Education/Training Program

## 2022-03-26 DIAGNOSIS — M4326 Fusion of spine, lumbar region: Secondary | ICD-10-CM

## 2022-03-26 DIAGNOSIS — M5116 Intervertebral disc disorders with radiculopathy, lumbar region: Secondary | ICD-10-CM

## 2022-04-05 ENCOUNTER — Ambulatory Visit
Admission: RE | Admit: 2022-04-05 | Discharge: 2022-04-05 | Disposition: A | Discharge status: Home / Self Care | Payer: Medicare Other | Source: Ambulatory Visit | Attending: Student in an Organized Health Care Education/Training Program | Admitting: Student in an Organized Health Care Education/Training Program

## 2022-04-05 DIAGNOSIS — M25551 Pain in right hip: Secondary | ICD-10-CM

## 2022-08-07 NOTE — Progress Notes (Signed)
Surgery orders requested via Epic inbox. °

## 2022-08-09 NOTE — H&P (Signed)
HIP ARTHROPLASTY ADMISSION H&P  Patient ID: [name] [name] MRN: [id number] DOB/AGE: 61-Feb-1962 61 y.o.  Chief Complaint: right hip pain.  Planned Procedure Date: 08/28/22 Medical and Cardiac Clearance by Roe Rutherford NP   Pain Management clearance by Verlin Fester PA-C   HPI: [name] [name] is a 62 y.o. 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 The patient has a history of pain and functional disability in the right hip due to arthritis and has failed non-surgical conservative treatments for greater than 12 weeks to include NSAID's and/or analgesics, corticosteriod injections, use of assistive devices, and activity modification.  Onset of symptoms was gradual, starting >10 years ago with gradually worsening course since that time. The patient noted no past surgery on the right hip.  Patient currently rates pain at 10 out of 10 with activity. Patient has night pain, worsening of pain with activity and weight bearing, and pain that interferes with activities of daily living.  Patient has evidence of subchondral sclerosis, periarticular osteophytes, and joint space narrowing by imaging studies.  There is no active infection.  Past Medical History:  Diagnosis Date   ALLERGIC RHINITIS    Asthma    Chronic headaches    DDD (degenerative disc disease)    Hyperlipidemia    Past Surgical History:  Procedure Laterality Date   CESAREAN SECTION     x3   CHOLECYSTECTOMY  08-2009   neck/back surgery  1998; 2012   NOVASURE ABLATION N/A 07/28/2012   Procedure: D&C NOVASURE ABLATION;  Surgeon: Genia Del, MD;  Location: [facility];  Service: Gynecology;  Laterality: N/A;   Allergies  Allergen Reactions   Hydromorphone Hives   Lisinopril Nausea And Vomiting   Morphine Hives   Sulfonamide Derivatives Hives   Prednisone Hives and Other (See Comments)    Hyper; only with oral steroids, she is ok with IM injection   Prior to Admission medications   Medication Sig Start Date End Date  Taking? Authorizing Provider  atorvastatin (LIPITOR) 10 MG tablet Take 10 mg by mouth daily. 03/06/17   [provider]  estradiol (CLIMARA - DOSED IN MG/24 HR) 0.075 mg/24hr patch PLACE 1 PATCH (0.075 MG TOTAL) ONTO THE SKIN ONCE A WEEK. 11/14/17   Genia Del, MD  gabapentin (NEURONTIN) 300 MG capsule Take 300 mg by mouth 3 (three) times daily. 03/04/17   [provider]  lisinopril-hydrochlorothiazide (PRINZIDE,ZESTORETIC) 20-25 MG tablet Take 1 tablet by mouth daily. 03/06/17   [provider]  omeprazole (PRILOSEC) 20 MG capsule Take 20 mg by mouth daily. 02/05/17   [provider]  ondansetron (ZOFRAN ODT) 4 MG disintegrating tablet Take 1 tablet (4 mg total) by mouth every 8 (eight) hours as needed for nausea or vomiting. 03/17/17   Street, Mercedes, PA-C  ranitidine (ZANTAC) 150 MG tablet Take 1 tablet (150 mg total) by mouth 2 (two) times daily. 03/17/17   Street, Mercedes, PA-C  tiZANidine (ZANAFLEX) 4 MG capsule Take 6 mg by mouth 3 (three) times daily as needed for muscle spasms.     [provider]   Social History   Socioeconomic History   Marital status: Single    Spouse name: Not on file   Number of children: Not on file   Years of education: Not on file   Highest education level: Not on file  Occupational History   Occupation: IT trainer  Tobacco Use   Smoking status: Every Day    Packs/day: 1.00    Years: 18.00  Additional pack years: 0.00    Total pack years: 18.00    Types: Cigarettes   Smokeless tobacco: Never  Substance and Sexual Activity   Alcohol use: No    Comment: scotch drinker previously-been months without   Drug use: No   Sexual activity: Not on file  Other Topics Concern   Not on file  Social History Narrative   Not on file   Social Determinants of Health   Financial Resource Strain: Not on file  Food Insecurity: Not on file  Transportation Needs: Not on file  Physical Activity: Not on file   Stress: Not on file  Social Connections: Not on file   Family History  Problem Relation Age of Onset   Heart disease Father        non smoker   Heart disease Paternal Grandfather        unknown if smoker-deceased when pt was 62 yrs old    ROS: Currently denies lightheadedness, dizziness, Fever, chills, CP, SOB.   No personal history of DVT, PE, MI, or CVA. No loose teeth Partial dentures are present  All other systems have been reviewed and were otherwise currently negative with the exception of those mentioned in the HPI and as above.  Objective: Vitals: Ht: 5'1" Wt: 207.6 lbs Temp: 98.6 BP: 170/103 in office but claims it was 135/90 Pulse: 82 O2 97% on room air.   Physical Exam: General: Alert, NAD. Trendelenberg Gait  HEENT: EOMI, Good Neck Extension  Pulm: No increased work of breathing. Slight expiratory rhonchi in the bilateral lower lung fields.  Clear B/L A/P otherwise w/o crackle or wheeze.  CV: RRR, No m/g/r appreciated  GI: soft, NT, ND. BS x 4 quadrants Neuro: CN II-XII grossly intact without focal deficit.  Sensation intact distally Skin: No lesions in the area of chief complaint MSK/Surgical Site: + TTP. Hip ROM decreased d/t pain. + Stinchfield. + SLR. + FABER/FADIR. Decreased strength.  NVI.    Imaging Review Plain radiographs demonstrate moderate degenerative joint disease of the right hip.   The bone quality appears to be fair for age and reported activity level.  Preoperative templating of the joint replacement has been completed, documented, and submitted to the Operating Room personnel in order to optimize intra-operative equipment management.  Assessment: OA RIGT HIP Active Problems:   * No active hospital problems. *   Plan: Plan for Procedure(s): TOTAL HIP ARTHROPLASTY ANTERIOR APPROACH  The patient history, physical exam, clinical judgement of the provider and imaging are consistent with end stage degenerative joint disease and total joint  arthroplasty is deemed medically necessary. The treatment options including medical management, injection therapy, and arthroplasty were discussed at length. The risks and benefits of Procedure(s): TOTAL HIP ARTHROPLASTY ANTERIOR APPROACH were presented and reviewed.  The risks of nonoperative treatment, versus surgical intervention including but not limited to continued pain, aseptic loosening, stiffness, dislocation/subluxation, infection, bleeding, nerve injury, blood clots, cardiopulmonary complications, morbidity, mortality, among others were discussed. The patient verbalizes understanding and wishes to proceed with the plan.  Patient is being admitted for surgery, pain control, PT, prophylactic antibiotics, VTE prophylaxis, progressive ambulation, ADL's and discharge planning.   Dental prophylaxis discussed and recommended for 2 years postoperatively.  The patient does meet the criteria for TXA which will be used perioperatively.   ASA 81 mg BID will be used postoperatively for DVT prophylaxis in addition to SCDs, and early ambulation. Plan for Oxycodone, Mobic, Tylenol for pain.   Robaxin for muscle spasm. Already  has enough at home.  Zofran for nausea and vomiting. Pharmacy- [facility] [address] The patient is planning to be discharged home with OPPT and into the care of her boyfriend Sherald Hess who can be reached at [phone] and her son Jacinto Reap who can be reached at [phone] Follow up appt 09/12/22 at [facility] [place] Office [phone] 08/09/2022 3:31 PM

## 2022-08-14 NOTE — Patient Instructions (Signed)
DUE TO COVID-19 ONLY TWO VISITORS  (aged 61 and older)  ARE ALLOWED TO COME WITH YOU AND STAY IN THE WAITING ROOM ONLY DURING PRE OP AND PROCEDURE.   **NO VISITORS ARE ALLOWED IN THE SHORT STAY AREA OR RECOVERY ROOM!!**  IF YOU WILL BE ADMITTED INTO THE HOSPITAL YOU ARE ALLOWED ONLY FOUR SUPPORT PEOPLE DURING VISITATION HOURS ONLY (7 AM -8PM)   The support person(s) must pass our screening, gel in and out, and wear a mask at all times, including in the patient's room. Patients must also wear a mask when staff or their support person are in the room. Visitors GUEST BADGE MUST BE WORN VISIBLY  One adult visitor may remain with you overnight and MUST be in the room by 8 P.M.     Your procedure is scheduled on: 08/28/22   Report to [facility] Main Entrance    Report to admitting at : 10:00 AM   Call this number if you have problems the morning of surgery [phone]   Do not eat food :After Midnight.   After Midnight you may have the following liquids until : 9:30 AM DAY OF SURGERY  Water Black Coffee (sugar ok, NO MILK/CREAM OR CREAMERS)  Tea (sugar ok, NO MILK/CREAM OR CREAMERS) regular and decaf                             Plain Jell-O (NO RED)                                           Fruit ices (not with fruit pulp, NO RED)                                     Popsicles (NO RED)                                                                  Juice: apple, WHITE grape, WHITE cranberry Sports drinks like Gatorade (NO RED)   The day of surgery:  Drink ONE (1) Pre-Surgery Clear Ensure at: 9:30 AM the morning of surgery. Drink in one sitting. Do not sip.  This drink was given to you during your hospital  pre-op appointment visit. Nothing else to drink after completing the  Pre-Surgery Clear Ensure or G2.          If you have questions, please contact your surgeon's office.   Oral Hygiene is also important to reduce your risk of infection.                                     Remember - BRUSH YOUR TEETH THE MORNING OF SURGERY WITH YOUR REGULAR TOOTHPASTE  DENTURES WILL BE REMOVED PRIOR TO SURGERY PLEASE DO NOT APPLY "Poly grip" OR ADHESIVES!!!   Do NOT smoke after Midnight   Take these medicines the morning of surgery with A SIP OF WATER: gabapentin,omeprazole,ranitidine.  You may not have any metal on your body including hair pins, jewelry, and body piercing             Do not wear make-up, lotions, powders, perfumes/cologne, or deodorant  Do not wear nail polish including gel and S&S, artificial/acrylic nails, or any other type of covering on natural nails including finger and toenails. If you have artificial nails, gel coating, etc. that needs to be removed by a nail salon please have this removed prior to surgery or surgery may need to be canceled/ delayed if the surgeon/ anesthesia feels like they are unable to be safely monitored.   Do not shave  48 hours prior to surgery.    Do not bring valuables to the hospital. [facility] IS NOT             RESPONSIBLE   FOR VALUABLES.   Contacts, glasses, or bridgework may not be worn into surgery.   Bring small overnight bag day of surgery.   DO NOT BRING YOUR HOME MEDICATIONS TO THE HOSPITAL. PHARMACY WILL DISPENSE MEDICATIONS LISTED ON YOUR MEDICATION LIST TO YOU DURING YOUR ADMISSION IN THE HOSPITAL!    Patients discharged on the day of surgery will not be allowed to drive home.  Someone NEEDS to stay with you for the first 24 hours after anesthesia.   Special Instructions: Bring a copy of your healthcare power of attorney and living will documents         the day of surgery if you haven't scanned them before.              Please read over the following fact sheets you were given: IF YOU HAVE QUESTIONS ABOUT YOUR PRE-OP INSTRUCTIONS PLEASE CALL [phone]      Pre-operative 5 CHG Bath Instructions   You can play a key role in reducing the risk of infection after surgery.  Your skin needs to be as free of germs as possible. You can reduce the number of germs on your skin by washing with CHG (chlorhexidine gluconate) soap before surgery. CHG is an antiseptic soap that kills germs and continues to kill germs even after washing.   DO NOT use if you have an allergy to chlorhexidine/CHG or antibacterial soaps. If your skin becomes reddened or irritated, stop using the CHG and notify one of our RNs at : [phone].   Please shower with the CHG soap starting 4 days before surgery using the following schedule:     Please keep in mind the following:  DO NOT shave, including legs and underarms, starting the day of your first shower.   You may shave your face at any point before/day of surgery.  Place clean sheets on your bed the day you start using CHG soap. Use a clean washcloth (not used since being washed) for each shower. DO NOT sleep with pets once you start using the CHG.   CHG Shower Instructions:  If you choose to wash your hair and private area, wash first with your normal shampoo/soap.  After you use shampoo/soap, rinse your hair and body thoroughly to remove shampoo/soap residue.  Turn the water OFF and apply about 3 tablespoons (45 ml) of CHG soap to a CLEAN washcloth.  Apply CHG soap ONLY FROM YOUR NECK DOWN TO YOUR TOES (washing for 3-5 minutes)  DO NOT use CHG soap on face, private areas, open wounds, or sores.  Pay special attention to the area where your surgery is being performed.  If you are having back surgery, having someone wash your back for you may be helpful. Wait 2 minutes after CHG soap is applied, then you may rinse off the CHG soap.  Pat dry with a clean towel  Put on clean clothes/pajamas   If you choose to wear lotion, please use ONLY the CHG-compatible lotions on the back of this paper.     Additional instructions for the day of surgery: DO NOT APPLY any lotions, deodorants, cologne, or perfumes.   Put on clean/comfortable clothes.   Brush your teeth.  Ask your nurse before applying any prescription medications to the skin.      CHG Compatible Lotions   Aveeno Moisturizing lotion  Cetaphil Moisturizing Cream  Cetaphil Moisturizing Lotion  Clairol Herbal Essence Moisturizing Lotion, Dry Skin  Clairol Herbal Essence Moisturizing Lotion, Extra Dry Skin  Clairol Herbal Essence Moisturizing Lotion, Normal Skin  Curel Age Defying Therapeutic Moisturizing Lotion with Alpha Hydroxy  Curel Extreme Care Body Lotion  Curel Soothing Hands Moisturizing Hand Lotion  Curel Therapeutic Moisturizing Cream, Fragrance-Free  Curel Therapeutic Moisturizing Lotion, Fragrance-Free  Curel Therapeutic Moisturizing Lotion, Original Formula  Eucerin Daily Replenishing Lotion  Eucerin Dry Skin Therapy Plus Alpha Hydroxy Crme  Eucerin Dry Skin Therapy Plus Alpha Hydroxy Lotion  Eucerin Original Crme  Eucerin Original Lotion  Eucerin Plus Crme Eucerin Plus Lotion  Eucerin TriLipid Replenishing Lotion  Keri Anti-Bacterial Hand Lotion  Keri Deep Conditioning Original Lotion Dry Skin Formula Softly Scented  Keri Deep Conditioning Original Lotion, Fragrance Free Sensitive Skin Formula  Keri Lotion Fast Absorbing Fragrance Free Sensitive Skin Formula  Keri Lotion Fast Absorbing Softly Scented Dry Skin Formula  Keri Original Lotion  Keri Skin Renewal Lotion Keri Silky Smooth Lotion  Keri Silky Smooth Sensitive Skin Lotion  Nivea Body Creamy Conditioning Oil  Nivea Body Extra Enriched Lotion  Nivea Body Original Lotion  Nivea Body Sheer Moisturizing Lotion Nivea Crme  Nivea Skin Firming Lotion  NutraDerm 30 Skin Lotion  NutraDerm Skin Lotion  NutraDerm Therapeutic Skin Cream  NutraDerm Therapeutic Skin Lotion  ProShield Protective Hand Cream  Provon moisturizing lotion   Incentive Spirometer  An incentive spirometer is a tool that can help keep your lungs clear and active. This tool measures how well you are filling your lungs  with each breath. Taking long deep breaths may help reverse or decrease the chance of developing breathing (pulmonary) problems (especially infection) following: A long period of time when you are unable to move or be active. BEFORE THE PROCEDURE  If the spirometer includes an indicator to show your best effort, your nurse or respiratory therapist will set it to a desired goal. If possible, sit up straight or lean slightly forward. Try not to slouch. Hold the incentive spirometer in an upright position. INSTRUCTIONS FOR USE  Sit on the edge of your bed if possible, or sit up as far as you can in bed or on a chair. Hold the incentive spirometer in an upright position. Breathe out normally. Place the mouthpiece in your mouth and seal your lips tightly around it. Breathe in slowly and as deeply as possible, raising the piston or the ball toward the top of the column. Hold your breath for 3-5 seconds or for as long as possible. Allow the piston or ball to fall to the bottom of the column. Remove the mouthpiece from your mouth and breathe out normally. Rest for a few seconds and repeat Steps 1 through 7 at least 10 times every  1-2 hours when you are awake. Take your time and take a few normal breaths between deep breaths. The spirometer may include an indicator to show your best effort. Use the indicator as a goal to work toward during each repetition. After each set of 10 deep breaths, practice coughing to be sure your lungs are clear. If you have an incision (the cut made at the time of surgery), support your incision when coughing by placing a pillow or rolled up towels firmly against it. Once you are able to get out of bed, walk around indoors and cough well. You may stop using the incentive spirometer when instructed by your caregiver.  RISKS AND COMPLICATIONS Take your time so you do not get dizzy or light-headed. If you are in pain, you may need to take or ask for pain medication before doing  incentive spirometry. It is harder to take a deep breath if you are having pain. AFTER USE Rest and breathe slowly and easily. It can be helpful to keep track of a log of your progress. Your caregiver can provide you with a simple table to help with this. If you are using the spirometer at home, follow these instructions: SEEK MEDICAL CARE IF:  You are having difficultly using the spirometer. You have trouble using the spirometer as often as instructed. Your pain medication is not giving enough relief while using the spirometer. You develop fever of 100.5 F (38.1 C) or higher. SEEK IMMEDIATE MEDICAL CARE IF:  You cough up bloody sputum that had not been present before. You develop fever of 102 F (38.9 C) or greater. You develop worsening pain at or near the incision site. MAKE SURE YOU:  Understand these instructions. Will watch your condition. Will get help right away if you are not doing well or get worse. Document Released: 07/16/2006 Document Revised: 05/28/2011 Document Reviewed: 09/16/2006 [facility] Patient Information 2014 [place], [place].   ________________________________________________________________________

## 2022-08-15 ENCOUNTER — Other Ambulatory Visit: Payer: Self-pay

## 2022-08-15 ENCOUNTER — Encounter (HOSPITAL_COMMUNITY): Payer: Self-pay

## 2022-08-15 ENCOUNTER — Encounter (HOSPITAL_COMMUNITY)
Admission: RE | Admit: 2022-08-15 | Discharge: 2022-08-15 | Disposition: A | Discharge status: Home / Self Care | Payer: Medicare Other | Source: Ambulatory Visit | Attending: Orthopedic Surgery | Admitting: Orthopedic Surgery

## 2022-08-15 VITALS — BP 140/91 | HR 70 | Temp 97.8°F | Ht 62.0 in | Wt 207.0 lb

## 2022-08-15 DIAGNOSIS — Z01818 Encounter for other preprocedural examination: Secondary | ICD-10-CM

## 2022-08-15 DIAGNOSIS — Z01812 Encounter for preprocedural laboratory examination: Secondary | ICD-10-CM | POA: Insufficient documentation

## 2022-08-15 HISTORY — DX: Essential (primary) hypertension: I10

## 2022-08-15 HISTORY — DX: Anxiety disorder, unspecified: F41.9

## 2022-08-15 HISTORY — DX: Dyspnea, unspecified: R06.00

## 2022-08-15 HISTORY — DX: Other specified postprocedural states: Z98.890

## 2022-08-15 HISTORY — DX: Depression, unspecified: F32.A

## 2022-08-15 HISTORY — DX: Other complications of anesthesia, initial encounter: T88.59XA

## 2022-08-15 LAB — TYPE AND SCREEN
ABO/RH(D): A POS
Antibody Screen: NEGATIVE

## 2022-08-15 LAB — SURGICAL PCR SCREEN
MRSA, PCR: NEGATIVE
Staphylococcus aureus: POSITIVE — AB

## 2022-08-15 NOTE — Progress Notes (Signed)
PCR: + STAPH °

## 2022-08-15 NOTE — Progress Notes (Addendum)
For Short Stay: COVID SWAB appointment date:  Bowel Prep reminder:   For Anesthesia: PCP - Lauren Dinsbeer: FNP. Clearance: Roe Rutherford: NP: 07/27/22: Chart. Cardiologist - N/A  Chest x-ray -  EKG - 07/25/22: CEW: requested Stress Test -  ECHO -  Cardiac Cath -  Pacemaker/ICD device last checked: Pacemaker orders received: Device Rep notified:  Spinal Cord Stimulator: Yes as per pt. It did not work.RN suggested to the pt. To bring control the day of surgery.  Sleep Study - N/A CPAP -   Fasting Blood Sugar -  Checks Blood Sugar _____ times a day Date and result of last Hgb A1c- 5.1: 07/25/22  Last dose of GLP1 agonist- N/A GLP1 instructions:   Last dose of SGLT-2 inhibitors- N/A SGLT-2 instructions:   Blood Thinner Instructions: N/A Aspirin Instructions: Last Dose:  Activity level: Can go up a flight of stairs and activities of daily living without stopping and without chest pain and/or shortness of breath   Able to exercise without chest pain and/or shortness of breath      Anesthesia review: Hx: HTN,Smoker.  Patient denies shortness of breath, fever, cough and chest pain at PAT appointment   Patient verbalized understanding of instructions that were given to them at the PAT appointment. Patient was also instructed that they will need to review over the PAT instructions again at home before surgery.

## 2022-08-28 ENCOUNTER — Ambulatory Visit (HOSPITAL_COMMUNITY): Payer: Medicare Other

## 2022-08-28 ENCOUNTER — Encounter (HOSPITAL_COMMUNITY): Payer: Self-pay | Admitting: Orthopedic Surgery

## 2022-08-28 ENCOUNTER — Encounter (HOSPITAL_COMMUNITY)
Admission: RE | Disposition: A | Discharge status: Home Health | Payer: Self-pay | Source: Home / Self Care | Attending: Orthopedic Surgery

## 2022-08-28 ENCOUNTER — Inpatient Hospital Stay (HOSPITAL_COMMUNITY)
Admission: RE | Admit: 2022-08-28 | Discharge: 2022-09-01 | DRG: 470 | Disposition: A | Discharge status: Home Health | Payer: Medicare Other | Attending: Orthopedic Surgery | Admitting: Orthopedic Surgery

## 2022-08-28 ENCOUNTER — Ambulatory Visit (HOSPITAL_COMMUNITY): Payer: Medicare Other | Admitting: Anesthesiology

## 2022-08-28 ENCOUNTER — Other Ambulatory Visit: Payer: Self-pay

## 2022-08-28 ENCOUNTER — Ambulatory Visit (HOSPITAL_BASED_OUTPATIENT_CLINIC_OR_DEPARTMENT_OTHER): Payer: Medicare Other | Admitting: Anesthesiology

## 2022-08-28 DIAGNOSIS — K5900 Constipation, unspecified: Secondary | ICD-10-CM | POA: Diagnosis present

## 2022-08-28 DIAGNOSIS — F1010 Alcohol abuse, uncomplicated: Secondary | ICD-10-CM | POA: Diagnosis present

## 2022-08-28 DIAGNOSIS — M1611 Unilateral primary osteoarthritis, right hip: Principal | ICD-10-CM | POA: Diagnosis present

## 2022-08-28 DIAGNOSIS — F17210 Nicotine dependence, cigarettes, uncomplicated: Secondary | ICD-10-CM | POA: Diagnosis present

## 2022-08-28 DIAGNOSIS — M549 Dorsalgia, unspecified: Secondary | ICD-10-CM | POA: Diagnosis present

## 2022-08-28 DIAGNOSIS — Z882 Allergy status to sulfonamides status: Secondary | ICD-10-CM

## 2022-08-28 DIAGNOSIS — Z885 Allergy status to narcotic agent status: Secondary | ICD-10-CM

## 2022-08-28 DIAGNOSIS — R1110 Vomiting, unspecified: Secondary | ICD-10-CM | POA: Diagnosis present

## 2022-08-28 DIAGNOSIS — F411 Generalized anxiety disorder: Secondary | ICD-10-CM | POA: Diagnosis present

## 2022-08-28 DIAGNOSIS — E669 Obesity, unspecified: Secondary | ICD-10-CM | POA: Diagnosis present

## 2022-08-28 DIAGNOSIS — Z96651 Presence of right artificial knee joint: Secondary | ICD-10-CM | POA: Diagnosis present

## 2022-08-28 DIAGNOSIS — E7439 Other disorders of intestinal carbohydrate absorption: Secondary | ICD-10-CM | POA: Diagnosis present

## 2022-08-28 DIAGNOSIS — E7801 Familial hypercholesterolemia: Secondary | ICD-10-CM | POA: Diagnosis present

## 2022-08-28 DIAGNOSIS — F17200 Nicotine dependence, unspecified, uncomplicated: Secondary | ICD-10-CM | POA: Diagnosis present

## 2022-08-28 DIAGNOSIS — G8929 Other chronic pain: Secondary | ICD-10-CM | POA: Diagnosis present

## 2022-08-28 DIAGNOSIS — R197 Diarrhea, unspecified: Secondary | ICD-10-CM | POA: Diagnosis present

## 2022-08-28 DIAGNOSIS — I10 Essential (primary) hypertension: Secondary | ICD-10-CM | POA: Diagnosis present

## 2022-08-28 DIAGNOSIS — Z79899 Other long term (current) drug therapy: Secondary | ICD-10-CM

## 2022-08-28 DIAGNOSIS — Z6837 Body mass index (BMI) 37.0-37.9, adult: Secondary | ICD-10-CM

## 2022-08-28 DIAGNOSIS — Z8249 Family history of ischemic heart disease and other diseases of the circulatory system: Secondary | ICD-10-CM

## 2022-08-28 DIAGNOSIS — Z9049 Acquired absence of other specified parts of digestive tract: Secondary | ICD-10-CM

## 2022-08-28 DIAGNOSIS — I160 Hypertensive urgency: Secondary | ICD-10-CM | POA: Diagnosis present

## 2022-08-28 DIAGNOSIS — Z96641 Presence of right artificial hip joint: Principal | ICD-10-CM

## 2022-08-28 DIAGNOSIS — E876 Hypokalemia: Secondary | ICD-10-CM | POA: Diagnosis present

## 2022-08-28 HISTORY — PX: TOTAL HIP ARTHROPLASTY: SHX124

## 2022-08-28 SURGERY — ARTHROPLASTY, HIP, TOTAL, ANTERIOR APPROACH
Anesthesia: Monitor Anesthesia Care | Site: Hip | Laterality: Right

## 2022-08-28 MED ORDER — ONDANSETRON HCL 4 MG/2ML IJ SOLN
INTRAMUSCULAR | Status: AC
  Filled 2022-08-28: qty 2

## 2022-08-28 MED ORDER — 0.9 % SODIUM CHLORIDE (POUR BTL) OPTIME
TOPICAL | Status: DC | PRN
  Administered 2022-08-28: 1000 mL

## 2022-08-28 MED ORDER — ALBUTEROL SULFATE HFA 108 (90 BASE) MCG/ACT IN AERS: 1.0000 | INHALATION_SPRAY | RESPIRATORY_TRACT | Status: DC | PRN

## 2022-08-28 MED ORDER — OXYCODONE HCL 5 MG PO TABS
10.0000 mg | ORAL_TABLET | ORAL | Status: DC | PRN
  Administered 2022-08-28 – 2022-08-30 (×9): 15 mg via ORAL
  Filled 2022-08-28 (×9): qty 3

## 2022-08-28 MED ORDER — METHOCARBAMOL 500 MG IVPB - SIMPLE MED
INTRAVENOUS | Status: AC
  Filled 2022-08-28: qty 55

## 2022-08-28 MED ORDER — DEXAMETHASONE SODIUM PHOSPHATE 10 MG/ML IJ SOLN
INTRAMUSCULAR | Status: DC | PRN
  Administered 2022-08-28: 10 mg via INTRAVENOUS

## 2022-08-28 MED ORDER — MIDAZOLAM HCL 2 MG/2ML IJ SOLN
INTRAMUSCULAR | Status: DC | PRN
  Administered 2022-08-28: 2 mg via INTRAVENOUS

## 2022-08-28 MED ORDER — ALBUTEROL SULFATE (2.5 MG/3ML) 0.083% IN NEBU: 2.5000 mg | INHALATION_SOLUTION | RESPIRATORY_TRACT | Status: DC | PRN

## 2022-08-28 MED ORDER — PHENYLEPHRINE 80 MCG/ML (10ML) SYRINGE FOR IV PUSH (FOR BLOOD PRESSURE SUPPORT)
PREFILLED_SYRINGE | INTRAVENOUS | Status: AC
  Filled 2022-08-28: qty 10

## 2022-08-28 MED ORDER — LABETALOL HCL 5 MG/ML IV SOLN
5.0000 mg | Freq: Once | INTRAVENOUS | Status: AC
  Administered 2022-08-28: 5 mg via INTRAVENOUS

## 2022-08-28 MED ORDER — ACETAMINOPHEN 10 MG/ML IV SOLN
INTRAVENOUS | Status: DC | PRN
  Administered 2022-08-28: 1000 mg via INTRAVENOUS

## 2022-08-28 MED ORDER — ATORVASTATIN CALCIUM 10 MG PO TABS
10.0000 mg | ORAL_TABLET | Freq: Every day | ORAL | Status: DC
  Administered 2022-08-28 – 2022-09-01 (×5): 10 mg via ORAL
  Filled 2022-08-28 (×7): qty 1

## 2022-08-28 MED ORDER — FENTANYL CITRATE PF 50 MCG/ML IJ SOSY
PREFILLED_SYRINGE | INTRAMUSCULAR | Status: AC
  Filled 2022-08-28: qty 1

## 2022-08-28 MED ORDER — MELOXICAM 15 MG PO TABS: 15.0000 mg | ORAL_TABLET | Freq: Every day | ORAL | 0 refills | Status: AC | PRN

## 2022-08-28 MED ORDER — LACTATED RINGERS IV SOLN: INTRAVENOUS | Status: DC

## 2022-08-28 MED ORDER — ACETAMINOPHEN 10 MG/ML IV SOLN: 1000.0000 mg | Freq: Once | INTRAVENOUS | Status: DC | PRN

## 2022-08-28 MED ORDER — OXYCODONE HCL 5 MG PO TABS: 5.0000 mg | ORAL_TABLET | ORAL | 0 refills | Status: AC | PRN

## 2022-08-28 MED ORDER — METOCLOPRAMIDE HCL 5 MG/ML IJ SOLN
5.0000 mg | Freq: Three times a day (TID) | INTRAMUSCULAR | Status: DC | PRN
  Administered 2022-08-28 (×2): 10 mg via INTRAVENOUS
  Filled 2022-08-28: qty 2

## 2022-08-28 MED ORDER — HYDRALAZINE HCL 20 MG/ML IJ SOLN
INTRAMUSCULAR | Status: AC
  Filled 2022-08-28: qty 1

## 2022-08-28 MED ORDER — PHENYLEPHRINE 80 MCG/ML (10ML) SYRINGE FOR IV PUSH (FOR BLOOD PRESSURE SUPPORT)
PREFILLED_SYRINGE | INTRAVENOUS | Status: DC | PRN
  Administered 2022-08-28 (×4): 160 ug via INTRAVENOUS

## 2022-08-28 MED ORDER — LIDOCAINE HCL (PF) 2 % IJ SOLN
INTRAMUSCULAR | Status: AC
  Filled 2022-08-28: qty 5

## 2022-08-28 MED ORDER — TRAMADOL HCL 50 MG PO TABS
50.0000 mg | ORAL_TABLET | Freq: Four times a day (QID) | ORAL | Status: DC
  Administered 2022-08-28 – 2022-08-30 (×8): 50 mg via ORAL
  Filled 2022-08-28 (×8): qty 1

## 2022-08-28 MED ORDER — OXYCODONE HCL 5 MG PO TABS
ORAL_TABLET | ORAL | Status: AC
  Filled 2022-08-28: qty 1

## 2022-08-28 MED ORDER — SODIUM CHLORIDE (PF) 0.9 % IJ SOLN
INTRAMUSCULAR | Status: AC
  Filled 2022-08-28: qty 10

## 2022-08-28 MED ORDER — DEXAMETHASONE SODIUM PHOSPHATE 10 MG/ML IJ SOLN: 8.0000 mg | Freq: Once | INTRAMUSCULAR | Status: DC

## 2022-08-28 MED ORDER — BISACODYL 10 MG RE SUPP: 10.0000 mg | Freq: Every day | RECTAL | Status: DC | PRN

## 2022-08-28 MED ORDER — CEFAZOLIN SODIUM-DEXTROSE 2-4 GM/100ML-% IV SOLN
2.0000 g | INTRAVENOUS | Status: AC
  Administered 2022-08-28: 2 g via INTRAVENOUS
  Filled 2022-08-28: qty 100

## 2022-08-28 MED ORDER — MAGNESIUM CITRATE PO SOLN
1.0000 | Freq: Once | ORAL | Status: AC | PRN
  Administered 2022-08-31: 1 via ORAL
  Filled 2022-08-28: qty 296

## 2022-08-28 MED ORDER — ONDANSETRON HCL 4 MG/2ML IJ SOLN
4.0000 mg | Freq: Four times a day (QID) | INTRAMUSCULAR | Status: DC | PRN
  Administered 2022-08-28 – 2022-08-29 (×2): 4 mg via INTRAVENOUS
  Filled 2022-08-28 (×2): qty 2

## 2022-08-28 MED ORDER — ACETAMINOPHEN 500 MG PO TABS: 1000.0000 mg | ORAL_TABLET | Freq: Four times a day (QID) | ORAL | 0 refills | Status: AC | PRN

## 2022-08-28 MED ORDER — ONDANSETRON HCL 4 MG/2ML IJ SOLN
4.0000 mg | Freq: Once | INTRAMUSCULAR | Status: AC
  Administered 2022-08-28: 4 mg via INTRAVENOUS
  Filled 2022-08-28: qty 2

## 2022-08-28 MED ORDER — LABETALOL HCL 5 MG/ML IV SOLN: 10.0000 mg | INTRAVENOUS | Status: DC | PRN

## 2022-08-28 MED ORDER — ALUM & MAG HYDROXIDE-SIMETH 200-200-20 MG/5ML PO SUSP
30.0000 mL | ORAL | Status: DC | PRN
  Administered 2022-08-31: 30 mL via ORAL
  Filled 2022-08-28: qty 30

## 2022-08-28 MED ORDER — DOCUSATE SODIUM 100 MG PO CAPS
100.0000 mg | ORAL_CAPSULE | Freq: Two times a day (BID) | ORAL | Status: DC
  Administered 2022-08-28 – 2022-09-01 (×8): 100 mg via ORAL
  Filled 2022-08-28 (×8): qty 1

## 2022-08-28 MED ORDER — LACTATED RINGERS IV BOLUS
500.0000 mL | Freq: Once | INTRAVENOUS | Status: AC
  Administered 2022-08-28: 500 mL via INTRAVENOUS

## 2022-08-28 MED ORDER — POVIDONE-IODINE 10 % EX SWAB: 2.0000 | Freq: Once | CUTANEOUS | Status: DC

## 2022-08-28 MED ORDER — ACETAMINOPHEN 500 MG PO TABS
1000.0000 mg | ORAL_TABLET | Freq: Four times a day (QID) | ORAL | Status: AC
  Administered 2022-08-28 – 2022-08-29 (×3): 1000 mg via ORAL
  Filled 2022-08-28 (×4): qty 2

## 2022-08-28 MED ORDER — AMISULPRIDE (ANTIEMETIC) 5 MG/2ML IV SOLN: 10.0000 mg | Freq: Once | INTRAVENOUS | Status: DC | PRN

## 2022-08-28 MED ORDER — TRANEXAMIC ACID-NACL 1000-0.7 MG/100ML-% IV SOLN: 1000.0000 mg | Freq: Once | INTRAVENOUS | Status: DC

## 2022-08-28 MED ORDER — BUPIVACAINE LIPOSOME 1.3 % IJ SUSP
INTRAMUSCULAR | Status: DC | PRN
  Administered 2022-08-28: 10 mL

## 2022-08-28 MED ORDER — FENTANYL CITRATE PF 50 MCG/ML IJ SOSY
25.0000 ug | PREFILLED_SYRINGE | INTRAMUSCULAR | Status: DC | PRN
  Administered 2022-08-28 (×3): 50 ug via INTRAVENOUS

## 2022-08-28 MED ORDER — FENTANYL CITRATE (PF) 250 MCG/5ML IJ SOLN
INTRAMUSCULAR | Status: DC | PRN
  Administered 2022-08-28 (×2): 50 ug via INTRAVENOUS

## 2022-08-28 MED ORDER — CEFAZOLIN SODIUM-DEXTROSE 2-4 GM/100ML-% IV SOLN
2.0000 g | Freq: Four times a day (QID) | INTRAVENOUS | Status: AC
  Administered 2022-08-28: 2 g via INTRAVENOUS
  Filled 2022-08-28: qty 100

## 2022-08-28 MED ORDER — OXYCODONE HCL 5 MG PO TABS
5.0000 mg | ORAL_TABLET | ORAL | Status: DC | PRN
  Administered 2022-08-28: 5 mg via ORAL
  Administered 2022-08-28: 10 mg via ORAL
  Filled 2022-08-28: qty 2

## 2022-08-28 MED ORDER — ONDANSETRON HCL 4 MG PO TABS
4.0000 mg | ORAL_TABLET | Freq: Four times a day (QID) | ORAL | Status: DC | PRN
  Administered 2022-08-29 – 2022-08-31 (×6): 4 mg via ORAL
  Filled 2022-08-28 (×6): qty 1

## 2022-08-28 MED ORDER — LACTATED RINGERS IV SOLN: INTRAVENOUS | Status: DC | PRN

## 2022-08-28 MED ORDER — METOCLOPRAMIDE HCL 5 MG PO TABS
5.0000 mg | ORAL_TABLET | Freq: Three times a day (TID) | ORAL | Status: DC | PRN
  Administered 2022-08-29: 5 mg via ORAL
  Filled 2022-08-28: qty 1

## 2022-08-28 MED ORDER — ONDANSETRON 4 MG PO TBDP: 4.0000 mg | ORAL_TABLET | Freq: Three times a day (TID) | ORAL | 0 refills | Status: AC | PRN

## 2022-08-28 MED ORDER — HYDROMORPHONE HCL 1 MG/ML IJ SOLN
INTRAMUSCULAR | Status: AC
  Filled 2022-08-28: qty 1

## 2022-08-28 MED ORDER — SODIUM CHLORIDE FLUSH 0.9 % IV SOLN
INTRAVENOUS | Status: DC | PRN
  Administered 2022-08-28: 10 mL

## 2022-08-28 MED ORDER — PANTOPRAZOLE SODIUM 40 MG PO TBEC
40.0000 mg | DELAYED_RELEASE_TABLET | Freq: Every day | ORAL | Status: DC
  Administered 2022-08-28 – 2022-09-01 (×5): 40 mg via ORAL
  Filled 2022-08-28 (×5): qty 1

## 2022-08-28 MED ORDER — LACTATED RINGERS IV BOLUS: 250.0000 mL | Freq: Once | INTRAVENOUS | Status: DC

## 2022-08-28 MED ORDER — CARVEDILOL 6.25 MG PO TABS
6.2500 mg | ORAL_TABLET | Freq: Two times a day (BID) | ORAL | Status: DC
  Administered 2022-08-28 – 2022-08-30 (×4): 6.25 mg via ORAL
  Filled 2022-08-28 (×4): qty 1

## 2022-08-28 MED ORDER — ASPIRIN 81 MG PO CHEW
81.0000 mg | CHEWABLE_TABLET | Freq: Two times a day (BID) | ORAL | Status: DC
  Administered 2022-08-28 – 2022-09-01 (×8): 81 mg via ORAL
  Filled 2022-08-28 (×8): qty 1

## 2022-08-28 MED ORDER — ASPIRIN 81 MG PO TBEC: 81.0000 mg | DELAYED_RELEASE_TABLET | Freq: Two times a day (BID) | ORAL | 0 refills | Status: AC

## 2022-08-28 MED ORDER — HYDROMORPHONE HCL 1 MG/ML IJ SOLN
0.5000 mg | INTRAMUSCULAR | Status: DC | PRN
  Administered 2022-08-28: 1 mg via INTRAVENOUS
  Administered 2022-08-28: 0.5 mg via INTRAVENOUS
  Filled 2022-08-28: qty 1

## 2022-08-28 MED ORDER — ACETAMINOPHEN 500 MG PO TABS
1000.0000 mg | ORAL_TABLET | Freq: Once | ORAL | Status: DC
  Filled 2022-08-28: qty 2

## 2022-08-28 MED ORDER — METHOCARBAMOL 500 MG PO TABS
500.0000 mg | ORAL_TABLET | Freq: Four times a day (QID) | ORAL | Status: DC | PRN
  Administered 2022-08-28 – 2022-09-01 (×10): 500 mg via ORAL
  Filled 2022-08-28 (×11): qty 1

## 2022-08-28 MED ORDER — ONDANSETRON HCL 4 MG/2ML IJ SOLN
INTRAMUSCULAR | Status: DC | PRN
  Administered 2022-08-28: 4 mg via INTRAVENOUS

## 2022-08-28 MED ORDER — ACETAMINOPHEN 325 MG PO TABS
325.0000 mg | ORAL_TABLET | Freq: Four times a day (QID) | ORAL | Status: DC | PRN
  Administered 2022-08-29 – 2022-08-30 (×2): 650 mg via ORAL
  Filled 2022-08-28 (×2): qty 2

## 2022-08-28 MED ORDER — DICYCLOMINE HCL 10 MG PO CAPS
10.0000 mg | ORAL_CAPSULE | Freq: Three times a day (TID) | ORAL | Status: DC
  Administered 2022-08-28 – 2022-09-01 (×14): 10 mg via ORAL
  Filled 2022-08-28 (×14): qty 1

## 2022-08-28 MED ORDER — METHOCARBAMOL 500 MG IVPB - SIMPLE MED
500.0000 mg | Freq: Four times a day (QID) | INTRAVENOUS | Status: DC | PRN
  Administered 2022-08-28: 500 mg via INTRAVENOUS

## 2022-08-28 MED ORDER — BUPIVACAINE LIPOSOME 1.3 % IJ SUSP: 10.0000 mL | Freq: Once | INTRAMUSCULAR | Status: DC

## 2022-08-28 MED ORDER — HYDRALAZINE HCL 20 MG/ML IJ SOLN
5.0000 mg | Freq: Once | INTRAMUSCULAR | Status: AC
  Administered 2022-08-28: 5 mg via INTRAVENOUS

## 2022-08-28 MED ORDER — FENTANYL CITRATE (PF) 100 MCG/2ML IJ SOLN
INTRAMUSCULAR | Status: AC
  Filled 2022-08-28: qty 2

## 2022-08-28 MED ORDER — PROPOFOL 500 MG/50ML IV EMUL
INTRAVENOUS | Status: DC | PRN
  Administered 2022-08-28: 100 ug/kg/min via INTRAVENOUS

## 2022-08-28 MED ORDER — PHENOL 1.4 % MT LIQD: 1.0000 | OROMUCOSAL | Status: DC | PRN

## 2022-08-28 MED ORDER — LABETALOL HCL 5 MG/ML IV SOLN
INTRAVENOUS | Status: AC
  Filled 2022-08-28: qty 4

## 2022-08-28 MED ORDER — PHENYLEPHRINE HCL-NACL 20-0.9 MG/250ML-% IV SOLN
INTRAVENOUS | Status: DC | PRN
  Administered 2022-08-28: 40 ug/min via INTRAVENOUS

## 2022-08-28 MED ORDER — LIDOCAINE 2% (20 MG/ML) 5 ML SYRINGE
INTRAMUSCULAR | Status: DC | PRN
  Administered 2022-08-28: 40 mg via INTRAVENOUS

## 2022-08-28 MED ORDER — WATER FOR IRRIGATION, STERILE IR SOLN
Status: DC | PRN
  Administered 2022-08-28: 2000 mL

## 2022-08-28 MED ORDER — METOCLOPRAMIDE HCL 5 MG/ML IJ SOLN
INTRAMUSCULAR | Status: AC
  Filled 2022-08-28: qty 2

## 2022-08-28 MED ORDER — PROPOFOL 1000 MG/100ML IV EMUL
INTRAVENOUS | Status: AC
  Filled 2022-08-28: qty 100

## 2022-08-28 MED ORDER — BUPIVACAINE LIPOSOME 1.3 % IJ SUSP
INTRAMUSCULAR | Status: AC
  Filled 2022-08-28: qty 10

## 2022-08-28 MED ORDER — CHLORHEXIDINE GLUCONATE 0.12 % MT SOLN
15.0000 mL | Freq: Once | OROMUCOSAL | Status: AC
  Administered 2022-08-28: 15 mL via OROMUCOSAL

## 2022-08-28 MED ORDER — DIPHENHYDRAMINE HCL 12.5 MG/5ML PO ELIX: 12.5000 mg | ORAL_SOLUTION | ORAL | Status: DC | PRN

## 2022-08-28 MED ORDER — DEXAMETHASONE SODIUM PHOSPHATE 10 MG/ML IJ SOLN
10.0000 mg | Freq: Once | INTRAMUSCULAR | Status: AC
  Administered 2022-08-29: 10 mg via INTRAVENOUS
  Filled 2022-08-28: qty 1

## 2022-08-28 MED ORDER — ACETAMINOPHEN 10 MG/ML IV SOLN
INTRAVENOUS | Status: AC
  Filled 2022-08-28: qty 100

## 2022-08-28 MED ORDER — DEXAMETHASONE SODIUM PHOSPHATE 10 MG/ML IJ SOLN
INTRAMUSCULAR | Status: AC
  Filled 2022-08-28: qty 1

## 2022-08-28 MED ORDER — ORAL CARE MOUTH RINSE: 15.0000 mL | Freq: Once | OROMUCOSAL | Status: AC

## 2022-08-28 MED ORDER — PROPOFOL 10 MG/ML IV BOLUS
INTRAVENOUS | Status: DC | PRN
  Administered 2022-08-28: 30 mg via INTRAVENOUS
  Administered 2022-08-28 (×3): 20 mg via INTRAVENOUS
  Administered 2022-08-28: 30 mg via INTRAVENOUS

## 2022-08-28 MED ORDER — TRANEXAMIC ACID-NACL 1000-0.7 MG/100ML-% IV SOLN
1000.0000 mg | INTRAVENOUS | Status: AC
  Administered 2022-08-28: 1000 mg via INTRAVENOUS
  Filled 2022-08-28: qty 100

## 2022-08-28 MED ORDER — SCOPOLAMINE 1 MG/3DAYS TD PT72
1.0000 | MEDICATED_PATCH | Freq: Once | TRANSDERMAL | Status: DC
  Administered 2022-08-28: 1.5 mg via TRANSDERMAL
  Filled 2022-08-28: qty 1

## 2022-08-28 MED ORDER — POLYETHYLENE GLYCOL 3350 17 G PO PACK
17.0000 g | PACK | Freq: Every day | ORAL | Status: DC | PRN
  Administered 2022-08-30 – 2022-08-31 (×2): 17 g via ORAL
  Filled 2022-08-28 (×2): qty 1

## 2022-08-28 MED ORDER — MENTHOL 3 MG MT LOZG: 1.0000 | LOZENGE | OROMUCOSAL | Status: DC | PRN

## 2022-08-28 SURGICAL SUPPLY — 47 items
APL PRP STRL LF DISP 70% ISPRP (MISCELLANEOUS) ×1
BAG COUNTER SPONGE SURGICOUNT (BAG) IMPLANT
BAG SPEC THK2 15X12 ZIP CLS (MISCELLANEOUS)
BAG SPNG CNTER NS LX DISP (BAG)
BAG ZIPLOCK 12X15 (MISCELLANEOUS) IMPLANT
BLADE SAG 18X100X1.27 (BLADE) ×2 IMPLANT
BLADE SURG SZ10 CARB STEEL (BLADE) ×2 IMPLANT
CHLORAPREP W/TINT 26 (MISCELLANEOUS) ×2 IMPLANT
CLSR STERI-STRIP ANTIMIC 1/2X4 (GAUZE/BANDAGES/DRESSINGS) ×2 IMPLANT
COVER PERINEAL POST (MISCELLANEOUS) ×2 IMPLANT
COVER SURGICAL LIGHT HANDLE (MISCELLANEOUS) ×2 IMPLANT
DRAPE IMP U-DRAPE 54X76 (DRAPES) ×2 IMPLANT
DRAPE STERI IOBAN 125X83 (DRAPES) ×2 IMPLANT
DRAPE U-SHAPE 47X51 STRL (DRAPES) ×4 IMPLANT
DRSG MEPILEX POST OP 4X8 (GAUZE/BANDAGES/DRESSINGS) ×2 IMPLANT
ELECT REM PT RETURN 15FT ADLT (MISCELLANEOUS) ×2 IMPLANT
GLOVE BIO SURGEON STRL SZ7.5 (GLOVE) ×2 IMPLANT
GLOVE BIOGEL PI IND STRL 7.5 (GLOVE) ×2 IMPLANT
GLOVE BIOGEL PI IND STRL 8 (GLOVE) ×2 IMPLANT
GLOVE SURG SYN 7.5 E (GLOVE) ×1 IMPLANT
GLOVE SURG SYN 7.5 PF PI (GLOVE) ×2 IMPLANT
GOWN STRL REUS W/ TWL LRG LVL3 (GOWN DISPOSABLE) ×2 IMPLANT
GOWN STRL REUS W/ TWL XL LVL3 (GOWN DISPOSABLE) ×2 IMPLANT
GOWN STRL REUS W/TWL LRG LVL3 (GOWN DISPOSABLE) ×1
GOWN STRL REUS W/TWL XL LVL3 (GOWN DISPOSABLE) ×1
HEAD BIOLOX HIP 36/-5 (Joint) IMPLANT
HIP BIOLOX HD 36/-5 (Joint) ×1 IMPLANT
HOLDER FOLEY CATH W/STRAP (MISCELLANEOUS) IMPLANT
INSERT 0 DEGREE 36 (Miscellaneous) IMPLANT
KIT TURNOVER KIT A (KITS) IMPLANT
MANIFOLD NEPTUNE II (INSTRUMENTS) ×2 IMPLANT
NS IRRIG 1000ML POUR BTL (IV SOLUTION) ×2 IMPLANT
PACK ANTERIOR HIP CUSTOM (KITS) ×2 IMPLANT
PROTECTOR NERVE ULNAR (MISCELLANEOUS) ×2 IMPLANT
SCREW HEX LP 6.5X20 (Screw) IMPLANT
SHELL ACETAB TRIDENT 48 (Shell) IMPLANT
SPIKE FLUID TRANSFER (MISCELLANEOUS) ×4 IMPLANT
STEM HIP 4 127DEG (Stem) IMPLANT
STRIP CLOSURE SKIN 1/2X4 (GAUZE/BANDAGES/DRESSINGS) IMPLANT
SUT MNCRL AB 3-0 PS2 18 (SUTURE) ×2 IMPLANT
SUT VIC AB 0 CT1 36 (SUTURE) ×2 IMPLANT
SUT VIC AB 1 CT1 36 (SUTURE) ×2 IMPLANT
SUT VIC AB 2-0 CT1 27 (SUTURE) ×2
SUT VIC AB 2-0 CT1 TAPERPNT 27 (SUTURE) ×4 IMPLANT
TRAY FOLEY MTR SLVR 16FR STAT (SET/KITS/TRAYS/PACK) IMPLANT
TUBE SUCTION HIGH CAP CLEAR NV (SUCTIONS) ×2 IMPLANT
WATER STERILE IRR 1000ML POUR (IV SOLUTION) ×4 IMPLANT

## 2022-08-28 NOTE — Anesthesia Procedure Notes (Signed)
Procedure Name: MAC Date/Time: 08/28/2022 12:05 PM  Performed by: Lovie Chol, CRNAPre-anesthesia Checklist: Patient identified, Emergency Drugs available, Suction available and Patient being monitored Oxygen Delivery Method: Simple face mask

## 2022-08-28 NOTE — Transfer of Care (Signed)
Immediate Anesthesia Transfer of Care Note  Patient: [name] [name]  Procedure(s) Performed: TOTAL HIP ARTHROPLASTY ANTERIOR APPROACH (Right: Hip)  Patient Location: PACU  Anesthesia Type:MAC and Spinal  Level of Consciousness: awake, alert , oriented, and patient cooperative  Airway & Oxygen Therapy: Patient Spontanous Breathing and Patient connected to face mask oxygen  Post-op Assessment: Report given to RN and Post -op Vital signs reviewed and stable  Post vital signs: Reviewed  Last Vitals:  Vitals Value Taken Time  BP 130/75 08/28/22 1406  Temp    Pulse 77 08/28/22 1409  Resp 18 08/28/22 1409  SpO2 100 % 08/28/22 1409  Vitals shown include unvalidated device data.  Last Pain:  Vitals:   08/28/22 1100  TempSrc:   PainSc: 8          Complications: No notable events documented.

## 2022-08-28 NOTE — Anesthesia Preprocedure Evaluation (Addendum)
Anesthesia Evaluation  Patient identified by MRN, date of birth, ID band Patient awake    Reviewed: Allergy & Precautions, NPO status , Patient's Chart, lab work & pertinent test results  History of Anesthesia Complications (+) PONV and history of anesthetic complications  Airway Mallampati: II  TM Distance: >3 FB Neck ROM: Full    Dental no notable dental hx. (+) Dental Advisory Given   Pulmonary asthma , Current Smoker and Patient abstained from smoking.   Pulmonary exam normal        Cardiovascular hypertension, Pt. on medications and Pt. on home beta blockers  Rhythm:Regular Rate:Normal     Neuro/Psych  Headaches  Anxiety Depression       GI/Hepatic negative GI ROS, Neg liver ROS,,,  Endo/Other  negative endocrine ROS    Renal/GU negative Renal ROS  negative genitourinary   Musculoskeletal  (+) Arthritis , Osteoarthritis,    Abdominal Normal abdominal exam  (+)   Peds  Hematology negative hematology ROS (+)   Anesthesia Other Findings   Reproductive/Obstetrics                              Anesthesia Physical Anesthesia Plan  ASA: 2  Anesthesia Plan: MAC and Spinal   Post-op Pain Management: Tylenol PO (pre-op)*   Induction: Intravenous  PONV Risk Score and Plan: 2 and Ondansetron, Dexamethasone, Propofol infusion, Midazolam, Treatment may vary due to age or medical condition, Scopolamine patch - Pre-op and Amisulpride  Airway Management Planned: Simple Face Mask and Nasal Cannula  Additional Equipment: None  Intra-op Plan:   Post-operative Plan:   Informed Consent: I have reviewed the patients History and Physical, chart, labs and discussed the procedure including the risks, benefits and alternatives for the proposed anesthesia with the patient or authorized representative who has indicated his/her understanding and acceptance.     Dental advisory given  Plan  Discussed with: CRNA  Anesthesia Plan Comments:         Anesthesia Quick Evaluation

## 2022-08-28 NOTE — Evaluation (Signed)
Physical Therapy Evaluation Patient Details Name: ADDILYNE CASAGRANDE MRN: [id number] DOB: 1960/08/28 Today's Date: 08/28/2022  History of Present Illness  62 yo female presents to therapy s/p R THA, anterior approach on 08/28/2022 due to failure of conservative measures. Pt PMH includes but is not limited to: asthma, chronic HA, DDD, and HDL.  Clinical Impression    TORAL BESTER is a 62 y.o. female POD 0 s/p R THA. Patient reports requiring assist for ADLs and functional mobiltiy with use of RW at baseline. Patient is now limited by functional impairments (see PT problem list below) and requires min guard with use of hospital bed for bed mobility and min guard with cues for transfers. Patient was able to ambulate 18 feet with RW and min guard level of assist. Patient instructed in exercise to facilitate ROM and circulation to manage edema.  Patient will benefit from continued skilled PT interventions to address impairments and progress towards PLOF. Acute PT will follow to progress mobility and stair training in preparation for safe discharge home with family and social support with OPPT.       Recommendations for follow up therapy are one component of a multi-disciplinary discharge planning process, led by the attending physician.  Recommendations may be updated based on patient status, additional functional criteria and insurance authorization.  Follow Up Recommendations       Assistance Recommended at Discharge Intermittent Supervision/Assistance  Patient can return home with the following  A little help with walking and/or transfers;A lot of help with bathing/dressing/bathroom;Assistance with cooking/housework;Assist for transportation;Help with stairs or ramp for entrance    Equipment Recommendations None recommended by PT (pt reports DME in home setting)  Recommendations for Other Services       Functional Status Assessment Patient has had a recent decline in their functional status and  demonstrates the ability to make significant improvements in function in a reasonable and predictable amount of time.     Precautions / Restrictions Precautions Precautions: Fall Restrictions Weight Bearing Restrictions: No      Mobility  Bed Mobility Overal bed mobility: Needs Assistance Bed Mobility: Supine to Sit     Supine to sit: Min guard, HOB elevated (bed rails)     General bed mobility comments: min cues    Transfers Overall transfer level: Needs assistance Equipment used: Rolling walker (2 wheels) Transfers: Sit to/from Stand, Bed to chair/wheelchair/BSC Sit to Stand: Min guard Stand pivot transfers: Min guard         General transfer comment: pt electing to pull to stand, ed provided and pt able to push to stand with 1 UE support min guard and cues for SPT bed to recliner  and STS from recliner    Ambulation/Gait Ambulation/Gait assistance: Min guard Gait Distance (Feet): 18 Feet Assistive device: Rolling walker (2 wheels) Gait Pattern/deviations: Step-to pattern, Antalgic Gait velocity: decreased        Stairs            Wheelchair Mobility    Modified Rankin (Stroke Patients Only)       Balance Overall balance assessment: History of Falls, Needs assistance Sitting-balance support: Feet supported Sitting balance-Leahy Scale: Good     Standing balance support: Bilateral upper extremity supported, During functional activity, Reliant on assistive device for balance Standing balance-Leahy Scale: Poor                               Pertinent Vitals/Pain Pain  Assessment Pain Assessment: 0-10 Pain Score: 6  Pain Location: R LE Pain Descriptors / Indicators: Aching, Constant, Discomfort, Operative site guarding Pain Intervention(s): Limited activity within patient's tolerance, Monitored during session, Premedicated before session, Repositioned, Ice applied    Home Living Family/patient expects to be discharged to:: Private  residence Living Arrangements: Alone Available Help at Discharge: Family;Friend(s) (friend Sam assisted pt with navigating steps PLOF) Type of Home: House Home Access: Stairs to enter Entrance Stairs-Rails: None Entrance Stairs-Number of Steps: 1 Alternate Level Stairs-Number of Steps: flight/16 Home Layout: Two level;Bed/bath upstairs Home Equipment: Agricultural consultant (2 wheels);Cane - single point;BSC/3in1;Shower seat;Hospital bed      Prior Function Prior Level of Function : History of Falls (last six months);Needs assist       Physical Assist : ADLs (physical);Mobility (physical) Mobility (physical): Stairs ADLs (physical): Bathing;Dressing (lower body dressing) Mobility Comments: pt reports using RW in home amb short distances, assist to navigate steps to access bathroom on second floor for showering, hospital bed on first floor primary residence following back sx 06/2021, light meal prep, family/son assist with household management and friend Sam assist with IADLs assist for car transfers, IND with medicaiton management       Hand Dominance        Extremity/Trunk Assessment        Lower Extremity Assessment Lower Extremity Assessment: RLE deficits/detail RLE Deficits / Details: ankle DF 4-/5, PF 4/5 RLE Sensation: decreased proprioception;decreased light touch;history of peripheral neuropathy (secondary to back sx)    Cervical / Trunk Assessment Cervical / Trunk Assessment: Back Surgery;Neck Surgery (head forward)  Communication   Communication: No difficulties  Cognition Arousal/Alertness: Awake/alert Behavior During Therapy: WFL for tasks assessed/performed Overall Cognitive Status: Within Functional Limits for tasks assessed                                          General Comments      Exercises Total Joint Exercises Ankle Circles/Pumps: AROM, Both, 20 reps   Assessment/Plan    PT Assessment Patient needs continued PT services  PT  Problem List Decreased strength;Decreased range of motion;Decreased activity tolerance;Decreased balance;Decreased mobility;Decreased coordination;Pain       PT Treatment Interventions DME instruction;Gait training;Stair training;Functional mobility training;Therapeutic activities;Therapeutic exercise;Balance training;Neuromuscular re-education;Patient/family education;Modalities    PT Goals (Current goals can be found in the Care Plan section)  Acute Rehab PT Goals Patient Stated Goal: to be able to walk my dog, go upstairs on my own PT Goal Formulation: With patient Time For Goal Achievement: 09/11/22 Potential to Achieve Goals: Good    Frequency 7X/week     Co-evaluation               AM-PAC PT "6 Clicks" Mobility  Outcome Measure Help needed turning from your back to your side while in a flat bed without using bedrails?: A Little Help needed moving from lying on your back to sitting on the side of a flat bed without using bedrails?: A Little Help needed moving to and from a bed to a chair (including a wheelchair)?: A Little Help needed standing up from a chair using your arms (e.g., wheelchair or bedside chair)?: A Little Help needed to walk in hospital room?: A Little Help needed climbing 3-5 steps with a railing? : A Lot 6 Click Score: 17    End of Session Equipment Utilized During Treatment: Gait belt Activity Tolerance:  Patient tolerated treatment well;No increased pain Patient left: in chair;with call bell/phone within reach;with nursing/sitter in room Nurse Communication: Mobility status PT Visit Diagnosis: Unsteadiness on feet (R26.81);Other abnormalities of gait and mobility (R26.89);Muscle weakness (generalized) (M62.81);History of falling (Z91.81);Difficulty in walking, not elsewhere classified (R26.2);Pain Pain - Right/Left: Right Pain - part of body: Hip    Time: 1610-9604 PT Time Calculation (min) (ACUTE ONLY): 39 min   Charges:   PT Evaluation $PT  Eval Low Complexity: 1 Low PT Treatments $Gait Training: 8-22 mins $Therapeutic Activity: 8-22 mins        Johnny Bridge, PT Acute Rehab   Jacqualyn Posey 08/28/2022, 7:04 PM

## 2022-08-28 NOTE — Op Note (Signed)
08/28/2022  1:32 PM  PATIENT:  [name] [name]   MRN: [id number]  PRE-OPERATIVE DIAGNOSIS:  OA RIGHT HIP  POST-OPERATIVE DIAGNOSIS:  OA RIGHT HIP  PROCEDURE:  Procedure(s): TOTAL HIP ARTHROPLASTY ANTERIOR APPROACH  PREOPERATIVE INDICATIONS:    MANETTE BAYLEY is an 62 y.o. female who has a diagnosis of S/P total right hip arthroplasty and elected for surgical management after failing conservative treatment.  The risks benefits and alternatives were discussed with the patient including but not limited to the risks of nonoperative treatment, versus surgical intervention including infection, bleeding, nerve injury, periprosthetic fracture, the need for revision surgery, dislocation, leg length discrepancy, blood clots, cardiopulmonary complications, morbidity, mortality, among others, and they were willing to proceed.     OPERATIVE REPORT     SURGEON:   Sheral Apley, MD    ASSISTANT:  Levester Fresh, PA-C, she was present and scrubbed throughout the case, critical for completion in a timely fashion, and for retraction, instrumentation, and closure.     ANESTHESIA:  General    COMPLICATIONS:  None.     COMPONENTS:  [employer] acolade fit femur size 45 with a 36 mm -5 head ball and an acetabular shell size 48 with a  polyethylene liner    PROCEDURE IN DETAIL:   The patient was met in the holding area and  identified.  The appropriate hip was identified and marked at the operative site.  The patient was then transported to the OR  and  placed under anesthesia per that record.  At that point, the patient was  placed in the supine position and  secured to the operating room table and all bony prominences padded. He received pre-operative antibiotics    The operative lower extremity was prepped from the iliac crest to the distal leg.  Sterile draping was performed.  Time out was performed prior to incision.      Skin incision was made just 2 cm lateral to the ASIS  extending in line with the  tensor fascia lata. Electrocautery was used to control all bleeders. I dissected down sharply to the fascia of the tensor fascia lata was confirmed that the muscle fibers beneath were running posteriorly. I then incised the fascia over the superficial tensor fascia lata in line with the incision. The fascia was elevated off the anterior aspect of the muscle the muscle was retracted posteriorly and protected throughout the case. I then used electrocautery to incise the tensor fascia lata fascia control and all bleeders. Immediately visible was the fat over top of the anterior neck and capsule.  I removed the anterior fat from the capsule and elevated the rectus muscle off of the anterior capsule. I then removed a large time of capsule. The retractors were then placed over the anterior acetabulum as well as around the superior and inferior neck.  I then made a femoral neck cut. Then used the power corkscrew to remove the femoral head from the acetabulum and thoroughly irrigated the acetabulum. I sized the femoral head.    I then exposed the deep acetabulum, cleared out any tissue including the ligamentum teres.   After adequate visualization, I excised the labrum, and then sequentially reamed.  I then impacted the acetabular implant into place using fluoroscopy for guidance.  Appropriate version and inclination was confirmed clinically matching their bony anatomy, and with fluoroscopy.  I placed a 20 mm screw in the posterior/superio position with an excellent bite.    I then placed the polyethylene liner  in place  I then adducted the leg and released the external rotators from the posterior femur allowing it to be easily delivered up lateral and anterior to the acetabulum for preparation of the femoral canal.    I then prepared the proximal femur using the cookie-cutter and then sequentially reamed and broached.  A trial broach, neck, and head was utilized, and I reduced the hip and used floroscopy to  assess the neck length and femoral implant.  I then impacted the femoral prosthesis into place into the appropriate version. The hip was then reduced and fluoroscopy confirmed appropriate position. Leg lengths were restored.  I then irrigated the hip copiously again with, and repaired the fascia with Vicryl, followed by monocryl for the subcutaneous tissue, Monocryl for the skin, Steri-Strips and sterile gauze. The patient was then awakened and returned to PACU in stable and satisfactory condition. There were no complications.  POST OPERATIVE PLAN: WBAT, DVT px: SCD's/TED, ambulation and chemical dvt px  Margarita Rana, MD Orthopedic Surgeon [phone]

## 2022-08-28 NOTE — Anesthesia Procedure Notes (Signed)
Spinal  Patient location during procedure: OB Start time: 08/28/2022 12:08 PM End time: 08/28/2022 12:13 PM Reason for block: surgical anesthesia Staffing Performed: resident/CRNA  Resident/CRNA: Lovie Chol, CRNA Performed by: Lovie Chol, CRNA Authorized by: Atilano Median, DO   Preanesthetic Checklist Completed: patient identified, IV checked, site marked, risks and benefits discussed, surgical consent, monitors and equipment checked, pre-op evaluation and timeout performed Spinal Block Patient position: sitting Prep: DuraPrep Patient monitoring: heart rate, cardiac monitor, continuous pulse ox and blood pressure Approach: midline Location: L4-5 Injection technique: single-shot Needle Needle type: Pencan  Needle gauge: 24 G Needle length: 10 cm Assessment Events: CSF return Additional Notes IV functioning, monitors applied to pt. Expiration date of kit checked and confirmed to be in date.  Sterile prep and drape, including hand hygiene, mask and sterile gloves were used. The patient was positioned and the spine was prepped. Skin was anesthetized with lidocaine. Free flow of clear CSF obtained prior to injecting local anesthetic into the CSF. Spinal needle aspirated freely following injection. Needle was carefully withdrawn, and pt tolerated procedure well. Loss of motor and sensory on exam post injection.

## 2022-08-28 NOTE — Discharge Instructions (Signed)

## 2022-08-28 NOTE — Anesthesia Postprocedure Evaluation (Signed)
Anesthesia Post Note  Patient: [name] [name]  Procedure(s) Performed: TOTAL HIP ARTHROPLASTY ANTERIOR APPROACH (Right: Hip)     Patient location during evaluation: PACU Anesthesia Type: MAC and Spinal Level of consciousness: awake and alert Pain management: pain level controlled Vital Signs Assessment: post-procedure vital signs reviewed and stable Respiratory status: spontaneous breathing, nonlabored ventilation, respiratory function stable and patient connected to nasal cannula oxygen Cardiovascular status: stable and blood pressure returned to baseline Postop Assessment: no apparent nausea or vomiting Anesthetic complications: no   No notable events documented.  Last Vitals:  Vitals:   08/28/22 1600 08/28/22 1653  BP: (!) 166/99 (!) 182/93  Pulse: 76 81  Resp: 18 20  Temp:  36.9 C  SpO2: 97% 96%    Last Pain:  Vitals:   08/28/22 1738  TempSrc:   PainSc: 7                  [name] P Furman [place]man

## 2022-08-28 NOTE — Plan of Care (Signed)
  Problem: Education: Goal: Knowledge of the prescribed therapeutic regimen will improve Outcome: Progressing   Problem: Activity: Goal: Ability to avoid complications of mobility impairment will improve Outcome: Progressing   Problem: Clinical Measurements: Goal: Postoperative complications will be avoided or minimized Outcome: Progressing   Problem: Pain Management: Goal: Pain level will decrease with appropriate interventions Outcome: Progressing   Problem: Education: Goal: Knowledge of General Education information will improve Description: Including pain rating scale, medication(s)/side effects and non-pharmacologic comfort measures Outcome: Progressing   Problem: Activity: Goal: Risk for activity intolerance will decrease Outcome: Progressing   Problem: Pain Managment: Goal: General experience of comfort will improve Outcome: Progressing   Problem: Safety: Goal: Ability to remain free from injury will improve Outcome: Progressing

## 2022-08-29 ENCOUNTER — Encounter (HOSPITAL_COMMUNITY): Payer: Self-pay | Admitting: Orthopedic Surgery

## 2022-08-29 DIAGNOSIS — K5900 Constipation, unspecified: Secondary | ICD-10-CM | POA: Diagnosis present

## 2022-08-29 DIAGNOSIS — E7801 Familial hypercholesterolemia: Secondary | ICD-10-CM | POA: Diagnosis present

## 2022-08-29 DIAGNOSIS — R1110 Vomiting, unspecified: Secondary | ICD-10-CM | POA: Diagnosis present

## 2022-08-29 DIAGNOSIS — Z6837 Body mass index (BMI) 37.0-37.9, adult: Secondary | ICD-10-CM | POA: Diagnosis not present

## 2022-08-29 DIAGNOSIS — Z96641 Presence of right artificial hip joint: Secondary | ICD-10-CM | POA: Diagnosis not present

## 2022-08-29 DIAGNOSIS — Z9049 Acquired absence of other specified parts of digestive tract: Secondary | ICD-10-CM | POA: Diagnosis not present

## 2022-08-29 DIAGNOSIS — Z8249 Family history of ischemic heart disease and other diseases of the circulatory system: Secondary | ICD-10-CM | POA: Diagnosis not present

## 2022-08-29 DIAGNOSIS — I160 Hypertensive urgency: Secondary | ICD-10-CM | POA: Diagnosis present

## 2022-08-29 DIAGNOSIS — F1010 Alcohol abuse, uncomplicated: Secondary | ICD-10-CM | POA: Diagnosis present

## 2022-08-29 DIAGNOSIS — E876 Hypokalemia: Secondary | ICD-10-CM | POA: Diagnosis present

## 2022-08-29 DIAGNOSIS — Z96651 Presence of right artificial knee joint: Secondary | ICD-10-CM | POA: Diagnosis present

## 2022-08-29 DIAGNOSIS — M1611 Unilateral primary osteoarthritis, right hip: Secondary | ICD-10-CM | POA: Diagnosis present

## 2022-08-29 DIAGNOSIS — M549 Dorsalgia, unspecified: Secondary | ICD-10-CM | POA: Diagnosis present

## 2022-08-29 DIAGNOSIS — I10 Essential (primary) hypertension: Secondary | ICD-10-CM | POA: Diagnosis present

## 2022-08-29 DIAGNOSIS — G8929 Other chronic pain: Secondary | ICD-10-CM | POA: Diagnosis present

## 2022-08-29 DIAGNOSIS — F17210 Nicotine dependence, cigarettes, uncomplicated: Secondary | ICD-10-CM | POA: Diagnosis present

## 2022-08-29 DIAGNOSIS — E7439 Other disorders of intestinal carbohydrate absorption: Secondary | ICD-10-CM | POA: Diagnosis present

## 2022-08-29 DIAGNOSIS — Z882 Allergy status to sulfonamides status: Secondary | ICD-10-CM | POA: Diagnosis not present

## 2022-08-29 DIAGNOSIS — F411 Generalized anxiety disorder: Secondary | ICD-10-CM | POA: Diagnosis present

## 2022-08-29 DIAGNOSIS — R197 Diarrhea, unspecified: Secondary | ICD-10-CM | POA: Diagnosis present

## 2022-08-29 DIAGNOSIS — Z79899 Other long term (current) drug therapy: Secondary | ICD-10-CM | POA: Diagnosis not present

## 2022-08-29 DIAGNOSIS — Z885 Allergy status to narcotic agent status: Secondary | ICD-10-CM | POA: Diagnosis not present

## 2022-08-29 DIAGNOSIS — E669 Obesity, unspecified: Secondary | ICD-10-CM | POA: Diagnosis present

## 2022-08-29 MED ORDER — HYDRALAZINE HCL 25 MG PO TABS: 25.0000 mg | ORAL_TABLET | ORAL | Status: DC | PRN

## 2022-08-29 MED ORDER — HYDRALAZINE HCL 50 MG PO TABS
50.0000 mg | ORAL_TABLET | ORAL | Status: DC | PRN
  Administered 2022-08-29 (×2): 50 mg via ORAL
  Filled 2022-08-29 (×2): qty 1

## 2022-08-29 MED ORDER — LABETALOL HCL 5 MG/ML IV SOLN
20.0000 mg | Freq: Once | INTRAVENOUS | Status: AC
  Administered 2022-08-29: 20 mg via INTRAVENOUS
  Filled 2022-08-29: qty 4

## 2022-08-29 MED ORDER — HYDRALAZINE HCL 25 MG PO TABS
25.0000 mg | ORAL_TABLET | Freq: Once | ORAL | Status: AC
  Administered 2022-08-29: 25 mg via ORAL
  Filled 2022-08-29: qty 1

## 2022-08-29 MED ORDER — TRAZODONE HCL 50 MG PO TABS
50.0000 mg | ORAL_TABLET | Freq: Every evening | ORAL | Status: DC | PRN
  Administered 2022-08-29 – 2022-08-31 (×3): 50 mg via ORAL
  Filled 2022-08-29 (×3): qty 1

## 2022-08-29 NOTE — Discharge Summary (Signed)
Physician Discharge Summary  Patient ID: [name] [name] MRN: [id number] DOB/AGE: [birth year] 62 y.o.  Admit date: 08/28/2022 Discharge date: 09/01/22  Admission Diagnoses: right hip OA  Discharge Diagnoses:  Principal Problem:   S/P total right hip arthroplasty Active Problems:   Tobacco use disorder   Chronic back pain   Familial hypercholesterolemia   GAD (generalized anxiety disorder)   Class 2 obesity   Hypertensive urgency   ETOH abuse   Glucose intolerance   Vomiting and diarrhea   Discharged Condition: fair  Hospital Course: Patient underwent a right THA with Dr. Eulah Pont on 08/28/22 without complications. She mobilized well with PT but did not feel comfortable discharging home so she spent the night in observation. Overnight she began having elevated BP despite pain medicine and her normal daily dose of Coreg. BP numbers elevated enough to reach hypertensive urgency range so the hospitalist team was consulted to help manage this.   Consults:  hospitalist  Significant Diagnostic Studies: n/a  Treatments: IV hydration, antibiotics: Ancef, analgesia: acetaminophen, Dilaudid, Tramadol, and Oxycodone, anticoagulation: ASA, therapies: PT and SW, and surgery: right THA  Discharge Exam: Blood pressure (!) 140/88, pulse 85, temperature 98.9 F (37.2 C), temperature source Oral, resp. rate 18, height 5\' 2"  (1.575 m), weight 93.9 kg, last menstrual period 10/23/2011, SpO2 94 %. General appearance: alert, cooperative, and no distress Head: Normocephalic, without obvious abnormality, atraumatic Resp: clear to auscultation bilaterally Cardio: RRR Extremities: extremities normal, atraumatic, no cyanosis or edema Pulses:  L brachial 2+ R brachial 2+  L radial 2+ R radial 2+  L inguinal 2+ R inguinal 2+  L popliteal 2+ R popliteal 2+  L posterior tibial 2+ R posterior tibial 2+  L dorsalis pedis 2+ R dorsalis pedis 2+   Neurologic: Grossly normal Incision/Wound:  c/d/i  Disposition: Discharge disposition: 01-Home or Self Care       Discharge Instructions     Call MD / Call 911   Complete by: As directed    If you experience chest pain or shortness of breath, CALL 911 and be transported to the hospital emergency room.  If you develope a fever above 101 F, pus (white drainage) or increased drainage or redness at the wound, or calf pain, call your surgeon's office.   Call MD / Call 911   Complete by: As directed    If you experience chest pain or shortness of breath, CALL 911 and be transported to the hospital emergency room.  If you develope a fever above 101 F, pus (white drainage) or increased drainage or redness at the wound, or calf pain, call your surgeon's office.   Constipation Prevention   Complete by: As directed    Drink plenty of fluids.  Prune juice may be helpful.  You may use a stool softener, such as Colace (over the counter) 100 mg twice a day.  Use MiraLax (over the counter) for constipation as needed.   Diet - low sodium heart healthy   Complete by: As directed    Discharge instructions   Complete by: As directed    You may bear weight as tolerated. Keep your dressing on and dry until follow up. Take medicine to prevent blood clots as directed. Take pain medicine as needed with the goal of transitioning to over the counter medicines.    INSTRUCTIONS AFTER JOINT REPLACEMENT   Remove items at home which could result in a fall. This includes throw rugs or furniture in walking pathways ICE to the affected  joint every three hours while awake for 30 minutes at a time, for at least the first 3-5 days, and then as needed for pain and swelling.  Continue to use ice for pain and swelling. You may notice swelling that will progress down to the foot and ankle.  This is normal after surgery.  Elevate your leg when you are not up walking on it.   Continue to use the breathing machine you got in the hospital (incentive spirometer) which  will help keep your temperature down.  It is common for your temperature to cycle up and down following surgery, especially at night when you are not up moving around and exerting yourself.  The breathing machine keeps your lungs expanded and your temperature down.   DIET:  As you were doing prior to hospitalization, we recommend a well-balanced diet.  DRESSING / WOUND CARE / SHOWERING  You may shower 3 days after surgery, but keep the wounds dry during showering.  You may use an occlusive plastic wrap (Press'n Seal for example) with blue painter's tape at edges, NO SOAKING/SUBMERGING IN THE BATHTUB.  If the bandage gets wet, call the office.   ACTIVITY  Increase activity slowly as tolerated, but follow the weight bearing instructions below.   No driving for 6 weeks or until further direction given by your physician.  You cannot drive while taking narcotics.  No lifting or carrying greater than 10 lbs. until further directed by your surgeon. Avoid periods of inactivity such as sitting longer than an hour when not asleep. This helps prevent blood clots.  You may return to work once you are authorized by your doctor.    WEIGHT BEARING   Weight bearing as tolerated with assist device (walker, cane, etc) as directed, use it as long as suggested by your surgeon or therapist, typically at least 4-6 weeks.   EXERCISES  Results after joint replacement surgery are often greatly improved when you follow the exercise, range of motion and muscle strengthening exercises prescribed by your doctor. Safety measures are also important to protect the joint from further injury. Any time any of these exercises cause you to have increased pain or swelling, decrease what you are doing until you are comfortable again and then slowly increase them. If you have problems or questions, call your caregiver or physical therapist for advice.   Rehabilitation is important following a joint replacement. After just a few  days of immobilization, the muscles of the leg can become weakened and shrink (atrophy).  These exercises are designed to build up the tone and strength of the thigh and leg muscles and to improve motion. Often times heat used for twenty to thirty minutes before working out will loosen up your tissues and help with improving the range of motion but do not use heat for the first two weeks following surgery (sometimes heat can increase post-operative swelling).   These exercises can be done on a training (exercise) mat, on the floor, on a table or on a bed. Use whatever works the best and is most comfortable for you.    Use music or television while you are exercising so that the exercises are a pleasant break in your day. This will make your life better with the exercises acting as a break in your routine that you can look forward to.   Perform all exercises about fifteen times, three times per day or as directed.  You should exercise both the operative leg and the other leg  as well.  Exercises include:   Quad Sets - Tighten up the muscle on the front of the thigh (Quad) and hold for 5-10 seconds.   Straight Leg Raises - With your knee straight (if you were given a brace, keep it on), lift the leg to 60 degrees, hold for 3 seconds, and slowly lower the leg.  Perform this exercise against resistance later as your leg gets stronger.  Leg Slides: Lying on your back, slowly slide your foot toward your buttocks, bending your knee up off the floor (only go as far as is comfortable). Then slowly slide your foot back down until your leg is flat on the floor again.  Angel Wings: Lying on your back spread your legs to the side as far apart as you can without causing discomfort.  Hamstring Strength:  Lying on your back, push your heel against the floor with your leg straight by tightening up the muscles of your buttocks.  Repeat, but this time bend your knee to a comfortable angle, and push your heel against the  floor.  You may put a pillow under the heel to make it more comfortable if necessary.   A rehabilitation program following joint replacement surgery can speed recovery and prevent re-injury in the future due to weakened muscles. Contact your doctor or a physical therapist for more information on knee rehabilitation.    CONSTIPATION  Constipation is defined medically as fewer than three stools per week and severe constipation as less than one stool per week.  Even if you have a regular bowel pattern at home, your normal regimen is likely to be disrupted due to multiple reasons following surgery.  Combination of anesthesia, postoperative narcotics, change in appetite and fluid intake all can affect your bowels.   YOU MUST use at least one of the following options; they are listed in order of increasing strength to get the job done.  They are all available over the counter, and you may need to use some, POSSIBLY even all of these options:    Drink plenty of fluids (prune juice may be helpful) and high fiber foods Colace 100 mg by mouth twice a day  Senokot for constipation as directed and as needed Dulcolax (bisacodyl), take with full glass of water  Miralax (polyethylene glycol) once or twice a day as needed.  If you have tried all these things and are unable to have a bowel movement in the first 3-4 days after surgery call either your surgeon or your primary doctor.    If you experience loose stools or diarrhea, hold the medications until you stool forms back up.  If your symptoms do not get better within 1 week or if they get worse, check with your doctor.  If you experience "the worst abdominal pain ever" or develop nausea or vomiting, please contact the office immediately for further recommendations for treatment.   ITCHING:  If you experience itching with your medications, try taking only a single pain pill, or even half a pain pill at a time.  You can also use Benadryl over the counter for  itching or also to help with sleep.   TED HOSE STOCKINGS:  Use stockings on both legs until for at least 2 weeks or as directed by physician office. They may be removed at night for sleeping.  MEDICATIONS:  See your medication summary on the "After Visit Summary" that nursing will review with you.  You may have some home medications which will be placed  on hold until you complete the course of blood thinner medication.  It is important for you to complete the blood thinner medication as prescribed.  Take medicines as prescribed.   You have several different medicines that work in different ways. - Tylenol is for mild to moderate pain. Try to take this medicine before turning to your narcotic medicines.  - Meloxicam is to reduce pain / inflammation - Robaxin is for muscle spasms. This medicine can make you drowsy. - Oxycodone is a narcotic pain medicine.  Take this for severe pain. This medicine can be dehydrating / constipating. - Zofran is for nausea and vomiting. - Aspirin is to prevent blood clots after surgery. YOU MUST TAKE THIS MEDICINE!  PRECAUTIONS:  If you experience chest pain or shortness of breath - call 911 immediately for transfer to the hospital emergency department.   If you develop a fever greater that 101 F, purulent drainage from wound, increased redness or drainage from wound, foul odor from the wound/dressing, or calf pain - CONTACT YOUR SURGEON.                                                   FOLLOW-UP APPOINTMENTS:  If you do not already have a post-op appointment, please call the office [phone] for an appointment to be seen by Dr. Eulah Pont in 2 weeks.   OTHER INSTRUCTIONS:   MAKE SURE YOU:  Understand these instructions.  Get help right away if you are not doing well or get worse.    Thank you for letting us be a part of your medical care team.  It is a privilege we respect greatly.  We hope these instructions will help you stay on track for a fast and full  recovery!   Driving restrictions   Complete by: As directed    No driving for 2-4 weeks   Follow the hip precautions as taught in Physical Therapy   Complete by: As directed    Increase activity slowly as tolerated   Complete by: As directed    Post-operative opioid taper instructions:   Complete by: As directed    POST-OPERATIVE OPIOID TAPER INSTRUCTIONS: It is important to wean off of your opioid medication as soon as possible. If you do not need pain medication after your surgery it is ok to stop day one. Opioids include: Codeine, Hydrocodone(Norco, Vicodin), Oxycodone(Percocet, oxycontin) and hydromorphone amongst others.  Long term and even short term use of opiods can cause: Increased pain response Dependence Constipation Depression Respiratory depression And more.  Withdrawal symptoms can include Flu like symptoms Nausea, vomiting And more Techniques to manage these symptoms Hydrate well Eat regular healthy meals Stay active Use relaxation techniques(deep breathing, meditating, yoga) Do Not substitute Alcohol to help with tapering If you have been on opioids for less than two weeks and do not have pain than it is ok to stop all together.  Plan to wean off of opioids This plan should start within one week post op of your joint replacement. Maintain the same interval or time between taking each dose and first decrease the dose.  Cut the total daily intake of opioids by one tablet each day Next start to increase the time between doses. The last dose that should be eliminated is the evening dose.      Post-operative opioid taper instructions:  Complete by: As directed    POST-OPERATIVE OPIOID TAPER INSTRUCTIONS: It is important to wean off of your opioid medication as soon as possible. If you do not need pain medication after your surgery it is ok to stop day one. Opioids include: Codeine, Hydrocodone(Norco, Vicodin), Oxycodone(Percocet, oxycontin) and hydromorphone  amongst others.  Long term and even short term use of opiods can cause: Increased pain response Dependence Constipation Depression Respiratory depression And more.  Withdrawal symptoms can include Flu like symptoms Nausea, vomiting And more Techniques to manage these symptoms Hydrate well Eat regular healthy meals Stay active Use relaxation techniques(deep breathing, meditating, yoga) Do Not substitute Alcohol to help with tapering If you have been on opioids for less than two weeks and do not have pain than it is ok to stop all together.  Plan to wean off of opioids This plan should start within one week post op of your joint replacement. Maintain the same interval or time between taking each dose and first decrease the dose.  Cut the total daily intake of opioids by one tablet each day Next start to increase the time between doses. The last dose that should be eliminated is the evening dose.      TED hose   Complete by: As directed    Use stockings (TED hose) for 2 weeks on right leg(s).  You may remove them at night for sleeping.   Weight bearing as tolerated   Complete by: As directed       Allergies as of 09/01/2022       Reactions   Hydromorphone Hives   Lisinopril Nausea And Vomiting   Morphine Hives   Sulfonamide Derivatives Hives   Prednisone Hives, Other (See Comments)   Hyper; only with oral steroids, she is ok with IM injection        Medication List     TAKE these medications    acetaminophen 500 MG tablet Commonly known as: TYLENOL Take 2 tablets (1,000 mg total) by mouth every 6 (six) hours as needed for mild pain or moderate pain. Notes to patient: Last dose given 06/13 01:27pm   albuterol 108 (90 Base) MCG/ACT inhaler Commonly known as: VENTOLIN HFA Inhale 1 puff into the lungs every 4 (four) hours as needed for wheezing. Notes to patient: Resume home regimen   ALPRAZolam 0.25 MG tablet Commonly known as: XANAX Take 1 tablet (0.25 mg  total) by mouth 2 (two) times daily as needed for up to 7 days for anxiety. Notes to patient: Last dose given 06/15 09:15am   amLODipine 5 MG tablet Commonly known as: NORVASC Take 1 tablet (5 mg total) by mouth daily.   aspirin EC 81 MG tablet Take 1 tablet (81 mg total) by mouth 2 (two) times daily. To prevent blood clots for 30 days after surgery.   atorvastatin 40 MG tablet Commonly known as: LIPITOR Take 10 mg by mouth daily.   carvedilol 12.5 MG tablet Commonly known as: COREG Take 1 tablet (12.5 mg total) by mouth 2 (two) times daily with a meal. What changed:  medication strength how much to take   dicyclomine 10 MG capsule Commonly known as: BENTYL Take 10 mg by mouth 4 (four) times daily -  before meals and at bedtime.   estradiol 0.075 mg/24hr patch Commonly known as: CLIMARA - Dosed in mg/24 hr PLACE 1 PATCH (0.075 MG TOTAL) ONTO THE SKIN ONCE A WEEK. Notes to patient: Resume home regimen   melatonin 3 MG Tabs tablet  Take 1 tablet (3 mg total) by mouth at bedtime as needed.   meloxicam 15 MG tablet Commonly known as: MOBIC Take 1 tablet (15 mg total) by mouth daily as needed for pain (and inflammation).   methocarbamol 500 MG tablet Commonly known as: ROBAXIN Take 500 mg by mouth daily.   ondansetron 4 MG disintegrating tablet Commonly known as: Zofran ODT Take 1 tablet (4 mg total) by mouth every 8 (eight) hours as needed for nausea or vomiting. What changed: Another medication with the same name was added. Make sure you understand how and when to take each. Notes to patient: Last dose given 06/14 05:30am   ondansetron 4 MG disintegrating tablet Commonly known as: ZOFRAN-ODT Take 1 tablet (4 mg total) by mouth every 8 (eight) hours as needed for nausea or vomiting. What changed: You were already taking a medication with the same name, and this prescription was added. Make sure you understand how and when to take each.   oxyCODONE 5 MG immediate release  tablet Commonly known as: Roxicodone Take 1 tablet (5 mg total) by mouth every 4 (four) hours as needed for severe pain. Notes to patient: Last dose given 06/15 07:52am   potassium chloride SA 20 MEQ tablet Commonly known as: KLOR-CON M Take 2 tablets (40 mEq total) by mouth daily for 3 days.   ranitidine 150 MG tablet Commonly known as: ZANTAC Take 1 tablet (150 mg total) by mouth 2 (two) times daily. Notes to patient: Resume home regimen   tiZANidine 4 MG capsule Commonly known as: ZANAFLEX Take 6 mg by mouth 3 (three) times daily as needed for muscle spasms. Notes to patient: Resume home regimen               Discharge Care Instructions  (From admission, onward)           Start     Ordered   08/28/22 0000  Weight bearing as tolerated        08/28/22 1408            Follow-up Information     Sheral Apley, MD. Go on 09/12/2022.   Specialty: Orthopedic Surgery Why: at 4pm Contact information: [address] [place] [address] [phone]         [facility] Follow up.   Specialty: Home Health Services Why: [facility] will provide PT in the home after discharge. Contact information: [address] [place] [address] [phone]                 Signed: Marzetta Board 09/03/2022, 2:38 PM

## 2022-08-29 NOTE — Care Management Obs Status (Signed)
MEDICARE OBSERVATION STATUS NOTIFICATION   Patient Details  Name: [name] [name] MRN: [id number] Date of Birth: 06/05/1960   Medicare Observation Status Notification Given:  Hart Robinsons, LCSW 08/29/2022, 2:29 PM

## 2022-08-29 NOTE — Progress Notes (Signed)
    Subjective: Patient reports pain as moderate.  Tolerating diet.  Urinating.   No CP, SOB.  Has mobilized OOB with PT yesterday. Having some issues with elevated BP post-op. Reports this happened last year after spine surgery too.  Objective:   VITALS:   Vitals:   08/28/22 2122 08/29/22 0223 08/29/22 0606 08/29/22 0936  BP: (!) 180/109 (!) 195/122 (!) 194/116 (!) 171/115  Pulse: 91 88 81 83  Resp: 18 18 18 17   Temp: 98.2 F (36.8 C) 98.4 F (36.9 C) 98 F (36.7 C) 98 F (36.7 C)  TempSrc: Oral Oral Oral   SpO2: 97% 96% 100% 97%  Weight:      Height:          Latest Ref Rng & Units 06/20/2017    3:34 PM 03/17/2017   10:23 AM 08/03/2012   11:30 PM  CBC  WBC 4.0 - 10.5 K/uL 6.6  6.9  5.6   Hemoglobin 12.0 - 15.0 g/dL 91.4  78.2  95.6   Hematocrit 36.0 - 46.0 % 44.1  47.3  43.0   Platelets 150 - 400 K/uL 235  253  234       Latest Ref Rng & Units 06/20/2017    3:34 PM 03/17/2017   10:23 AM 08/03/2012   11:30 PM  BMP  Glucose 65 - 99 mg/dL 96  213  086   BUN 6 - 20 mg/dL 37  22  14   Creatinine 0.44 - 1.00 mg/dL 5.78  4.69  6.29   Sodium 135 - 145 mmol/L 137  136  141   Potassium 3.5 - 5.1 mmol/L 4.1  4.1  3.7   Chloride 101 - 111 mmol/L 98  103  106   CO2 22 - 32 mmol/L 26  25  24    Calcium 8.9 - 10.3 mg/dL 9.4  9.3  9.4    Intake/Output      06/11 0701 06/12 0700 06/12 0701 06/13 0700   P.O. 1000 360   I.V. (mL/kg) 1300 (13.8)    IV Piggyback 455    Total Intake(mL/kg) 2755 (29.3) 360 (3.8)   Urine (mL/kg/hr) 1500 200 (0.8)   Blood 250    Total Output 1750 200   Net +1005 +160           Physical Exam: General: NAD.  Laying in bed, calm, comfortable Resp: No increased wob Cardio: regular rate and rhythm ABD soft Neurologically intact MSK Neurovascularly intact Sensation intact distally Intact pulses distally Dorsiflexion/Plantar flexion intact Incision: dressing C/D/I   Assessment: 1 Day Post-Op  S/P Procedure(s) (LRB): TOTAL HIP ARTHROPLASTY  ANTERIOR APPROACH (Right) by Dr. Jewel Baize. Murphy on 08/28/22  Principal Problem:   S/P total right hip arthroplasty   Plan: Will consult hospitalist for hypertensive urgency/crisis management  Advance diet Up with therapy Incentive Spirometry Elevate and Apply ice  Weightbearing: WBAT RLE Insicional and dressing care: Dressings left intact until follow-up and Reinforce dressings as needed Orthopedic device(s): None Showering: Keep dressing dry VTE prophylaxis: Aspirin 81mg  BID  x 30 days postop , SCDs, ambulation Pain control: PRN Follow - up plan: 2 weeks Contact information:  Margarita Rana MD, Levester Fresh PA-C  Dispo: Home once pain controlled, passes PT, and BP better controlled.     Jenne Pane, PA-C Office [phone] 08/29/2022, 9:48 AM

## 2022-08-29 NOTE — Plan of Care (Signed)
  Problem: Education: Goal: Knowledge of the prescribed therapeutic regimen will improve Outcome: Progressing Goal: Understanding of discharge needs will improve Outcome: Progressing Goal: Individualized Educational Video(s) Outcome: Completed/Met   Problem: Activity: Goal: Ability to avoid complications of mobility impairment will improve Outcome: Adequate for Discharge Goal: Ability to tolerate increased activity will improve Outcome: Progressing   Problem: Clinical Measurements: Goal: Postoperative complications will be avoided or minimized Outcome: Progressing   Problem: Pain Management: Goal: Pain level will decrease with appropriate interventions Outcome: Progressing   Problem: Education: Goal: Knowledge of General Education information will improve Description: Including pain rating scale, medication(s)/side effects and non-pharmacologic comfort measures Outcome: Progressing   Problem: Health Behavior/Discharge Planning: Goal: Ability to manage health-related needs will improve Outcome: Progressing   Problem: Clinical Measurements: Goal: Ability to maintain clinical measurements within normal limits will improve Outcome: Progressing Goal: Will remain free from infection Outcome: Progressing Goal: Diagnostic test results will improve Outcome: Progressing Goal: Respiratory complications will improve Outcome: Progressing Goal: Cardiovascular complication will be avoided Outcome: Progressing   Problem: Activity: Goal: Risk for activity intolerance will decrease Outcome: Progressing   Problem: Nutrition: Goal: Adequate nutrition will be maintained Outcome: Progressing   Problem: Coping: Goal: Level of anxiety will decrease Outcome: Progressing   Problem: Elimination: Goal: Will not experience complications related to bowel motility Outcome: Progressing Goal: Will not experience complications related to urinary retention Outcome: Progressing   Problem:  Pain Managment: Goal: General experience of comfort will improve Outcome: Progressing   Problem: Safety: Goal: Ability to remain free from injury will improve Outcome: Progressing

## 2022-08-29 NOTE — Progress Notes (Addendum)
Physical Therapy Treatment Patient Details Name: [name] [name] MRN: [id number] DOB: [birth year] Today's Date: 08/29/2022   History of Present Illness 62 yo female presents to therapy s/p R THA, anterior approach on 08/28/2022 due to failure of conservative measures. Pt PMH includes but is not limited to: asthma, chronic HA, DDD, and HDL.    PT Comments    Pt agreeable to therapy session. BP remains quite elevated- L UE: 185/101, R UE: 207/125 (with dynamap-did not manually assess). RN in room assessing BP again at end of session. Pt is concerned which is understandable. Deferred stair negotiation training on today due to anticipating pt may remain overnight for continued monitoring??? Will continue to progress activity as safely able with plans for stair training on tomorrow.    Recommendations for follow up therapy are one component of a multi-disciplinary discharge planning process, led by the attending physician.  Recommendations may be updated based on patient status, additional functional criteria and insurance authorization.  Follow Up Recommendations       Assistance Recommended at Discharge Intermittent Supervision/Assistance  Patient can return home with the following A little help with walking and/or transfers;A lot of help with bathing/dressing/bathroom;Assistance with cooking/housework;Assist for transportation;Help with stairs or ramp for entrance   Equipment Recommendations  None recommended by PT    Recommendations for Other Services       Precautions / Restrictions Precautions Precautions: Fall Restrictions Weight Bearing Restrictions: No Other Position/Activity Restrictions: WBAT     Mobility  Bed Mobility Overal bed mobility: Needs Assistance Bed Mobility: Supine to Sit, Sit to Supine     Supine to sit: Min guard, HOB elevated Sit to supine: Min guard, HOB elevated   General bed mobility comments: Increased time.    Transfers Overall transfer level:  Needs assistance Equipment used: Rolling walker (2 wheels) Transfers: Sit to/from Stand Sit to Stand: Min guard           General transfer comment: Min guard for safety.    Ambulation/Gait Ambulation/Gait assistance: Min guard Gait Distance (Feet): 75 Feet Assistive device: Rolling walker (2 wheels) Gait Pattern/deviations: Step-through pattern, Decreased stride length, Antalgic       General Gait Details: Gait mildly antalgic this afternoon. Slow but steady gait. Pt denied dizziness. Tolerating distance well.   Stairs Stairs:  (Deferred on today. BP still quite elevated. Will plan to practice on tomorrow.)           Wheelchair Mobility    Modified Rankin (Stroke Patients Only)       Balance Overall balance assessment: Needs assistance         Standing balance support: Bilateral upper extremity supported, During functional activity, Reliant on assistive device for balance Standing balance-Leahy Scale: Fair                              Cognition Arousal/Alertness: Awake/alert Behavior During Therapy: WFL for tasks assessed/performed Overall Cognitive Status: Within Functional Limits for tasks assessed                                          Exercises Total Joint Exercises Ankle Circles/Pumps: AROM, Both, 10 reps Quad Sets: AROM, 10 reps, Right Heel Slides: AAROM, Right, 10 reps (used gait belt) Hip ABduction/ADduction: AAROM, Right, 10 reps (used gait belt)    General Comments  Pertinent Vitals/Pain Pain Assessment Pain Assessment: 0-10 Pain Score: 6  Pain Location: R hip/thigh Pain Descriptors / Indicators: Burning, Tightness, Operative site guarding Pain Intervention(s): Limited activity within patient's tolerance, Monitored during session, Repositioned    Home Living                          Prior Function            PT Goals (current goals can now be found in the care plan section)  Progress towards PT goals: Progressing toward goals    Frequency    7X/week      PT Plan Current plan remains appropriate    Co-evaluation              AM-PAC PT "6 Clicks" Mobility   Outcome Measure  Help needed turning from your back to your side while in a flat bed without using bedrails?: A Little Help needed moving from lying on your back to sitting on the side of a flat bed without using bedrails?: A Little Help needed moving to and from a bed to a chair (including a wheelchair)?: A Little Help needed standing up from a chair using your arms (e.g., wheelchair or bedside chair)?: A Little Help needed to walk in hospital room?: A Little Help needed climbing 3-5 steps with a railing? : A Little 6 Click Score: 18    End of Session Equipment Utilized During Treatment: Gait belt Activity Tolerance: Patient tolerated treatment well Patient left: in bed;with call bell/phone within reach   PT Visit Diagnosis: Other abnormalities of gait and mobility (R26.89);Pain Pain - Right/Left: Right Pain - part of body: Hip     Time: 1610-9604 PT Time Calculation (min) (ACUTE ONLY): 12 min  Charges:  $Gait Training: 8-22 mins                         Faye Ramsay, PT Acute Rehabilitation  Office: [phone]

## 2022-08-29 NOTE — Consult Note (Signed)
Initial Consultation Note   Patient: [name] [name] ZHY:[id number] DOB: 26-Jul-1960 PCP: Lauretta Grill, FNP DOA: 08/28/2022 DOS: the patient was seen and examined on 08/29/2022 Primary service: Sheral Apley, MD  Referring physician: Margarita Rana, MD. Reason for consult: Uncontrolled hypertension. Assessment and Plan: Principal Problem:   S/P total right hip arthroplasty Continue postop care per orthopedic surgery  Active Problems:   Hypertensive urgency In the setting of postop pain/hospitalization. Continue carvedilol 6.25 mg p.o. twice daily. Alcohol and tobacco use cessation. Weight loss and other lifestyle modifications. Continue hydralazine 50 mg p.o. every 4 hours PRN. As needed labetalol IVP if hydralazine not fully effective.    Tobacco use disorder Declined nicotine replacement therapy at this moment.    Chronic back pain Analgesics and muscle relaxants as needed.    Familial hypercholesterolemia Continue atorvastatin 40 mg p.o. daily.    GAD (generalized anxiety disorder) Alcohol cessation advised. Trazodone as needed for insomnia while in the hospital.    Class 2 obesity Current BMI 37.86 kg/m. Briefly discussed with the patient. Alcohol cessation would help a lot. Needs lifestyle modifications. Follow-up closely with PCP.    ETOH abuse Per patient she drinks 2 ounces nightly to go to sleep. Denied any history of withdrawal symptoms. She last drank on Monday evening. Monitor for withdrawal symptoms.    Glucose intolerance Last month fasting glucose over 100. Hemoglobin A1c was normal. Advised weight loss and other lifestyle modifications. Follow-up closely with primary provider.    Vomiting and diarrhea On and off. Anxiety versus back surgery postop issue. She has been seen by GI and by the back surgeon. She has been constipated for the past 2 days.    TRH will continue to follow the patient.  HPI: [name] [name] is a 62 y.o. female  with past medical history of allergic rhinitis, anxiety, depression, asthma, chronic headaches, degenerative disc disease, unspecified dyspnea, hyperlipidemia, hypertension who underwent right hip TKA due to osteoarthritis yesterday and we are evaluating due to uncontrolled hypertension.  She received 5 mg of labetalol IVP and 5 mg of hydralazine IVP yesterday.  She is taking carvedilol 6.25 mg p.o. twice daily.  Hydralazine 25 mg tablet p.o. x 1 given earlier with no significant results.  Labetalol 20 mg IVP given.  Hydralazine increased to 50 mg every 4 hours as needed.  The patient stated that she had similar postop issues with her BP after her back surgery.  Since then, cardiology increased her carvedilol 6.25 p.o. twice daily.  She has been on lisinopril in the past, but was very sensitive and frequently lightheaded with it.  No fever, chills or night sweats. No sore throat, rhinorrhea, dyspnea, wheezing or hemoptysis.  No chest pain, palpitations, diaphoresis, PND, orthopnea or pitting edema of the lower extremities.  No appetite changes, abdominal pain, diarrhea, constipation, melena or hematochezia.  No flank pain, dysuria, frequency or hematuria.  No polyuria, polydipsia or polyphagia.  Review of Systems: As mentioned in the history of present illness. All other systems reviewed and are negative. Past Medical History:  Diagnosis Date   ALLERGIC RHINITIS    Anxiety    Asthma    Chronic headaches    Complication of anesthesia    DDD (degenerative disc disease)    Depression    Dyspnea    Hyperlipidemia    Hypertension    PONV (postoperative nausea and vomiting)    Past Surgical History:  Procedure Laterality Date   CESAREAN SECTION     x3  CHOLECYSTECTOMY  08/17/2009   KNEE ARTHROSCOPY Right    neck/back surgery  1998; 2012   NOVASURE ABLATION N/A 07/28/2012   Procedure: D&C NOVASURE ABLATION;  Surgeon: Genia Del, MD;  Location: [facility];  Service: Gynecology;  Laterality: N/A;    SHOULDER ARTHROTOMY Right    TENDON RELEASE Right    thumb   TONSILLECTOMY     TOTAL HIP ARTHROPLASTY Right 08/28/2022   Procedure: TOTAL HIP ARTHROPLASTY ANTERIOR APPROACH;  Surgeon: Sheral Apley, MD;  Location: [facility];  Service: Orthopedics;  Laterality: Right;   Social History:  reports that she has been smoking cigarettes. She has a 18.00 pack-year smoking history. She has never used smokeless tobacco. She reports current alcohol use of about 2.0 standard drinks of alcohol per week. She reports that she does not use drugs.  Allergies  Allergen Reactions   Hydromorphone Hives   Lisinopril Nausea And Vomiting   Morphine Hives   Sulfonamide Derivatives Hives   Prednisone Hives and Other (See Comments)    Hyper; only with oral steroids, she is ok with IM injection    Family History  Problem Relation Age of Onset   Heart disease Father        non smoker   Heart disease Paternal Grandfather        unknown if smoker-deceased when pt was 62 yrs old    Prior to Admission medications   Medication Sig Start Date End Date Taking? Authorizing Provider  acetaminophen (TYLENOL) 500 MG tablet Take 2 tablets (1,000 mg total) by mouth every 6 (six) hours as needed for mild pain or moderate pain. 08/28/22  Yes Gawne, Meghan M, PA-C  albuterol (VENTOLIN HFA) 108 (90 Base) MCG/ACT inhaler Inhale 1 puff into the lungs every 4 (four) hours as needed for wheezing. 06/08/21  Yes [provider]  aspirin EC 81 MG tablet Take 1 tablet (81 mg total) by mouth 2 (two) times daily. To prevent blood clots for 30 days after surgery. 08/28/22  Yes Gawne, Meghan M, PA-C  atorvastatin (LIPITOR) 40 MG tablet Take 10 mg by mouth daily. 03/06/17  Yes [provider]  carvedilol (COREG) 6.25 MG tablet Take 6.25 mg by mouth 2 (two) times daily with a meal. 06/29/21  Yes [provider]  dicyclomine (BENTYL) 10 MG capsule Take 10 mg by mouth 4 (four) times daily -  before meals and at  bedtime.   Yes [provider]  meloxicam (MOBIC) 15 MG tablet Take 1 tablet (15 mg total) by mouth daily as needed for pain (and inflammation). 08/28/22  Yes Gawne, Meghan M, PA-C  ondansetron (ZOFRAN-ODT) 4 MG disintegrating tablet Take 1 tablet (4 mg total) by mouth every 8 (eight) hours as needed for nausea or vomiting. 08/28/22  Yes Gawne, Meghan M, PA-C  oxyCODONE (ROXICODONE) 5 MG immediate release tablet Take 1 tablet (5 mg total) by mouth every 4 (four) hours as needed for severe pain. 08/28/22  Yes Gawne, Meghan M, PA-C  tiZANidine (ZANAFLEX) 4 MG capsule Take 6 mg by mouth 3 (three) times daily as needed for muscle spasms.    Yes [provider]  estradiol (CLIMARA - DOSED IN MG/24 HR) 0.075 mg/24hr patch PLACE 1 PATCH (0.075 MG TOTAL) ONTO THE SKIN ONCE A WEEK. Patient not taking: Reported on 08/15/2022 11/14/17   Genia Del, MD  methocarbamol (ROBAXIN) 500 MG tablet Take 500 mg by mouth daily. 07/16/22   [provider]  ondansetron (ZOFRAN ODT) 4 MG disintegrating tablet  Take 1 tablet (4 mg total) by mouth every 8 (eight) hours as needed for nausea or vomiting. Patient not taking: Reported on 08/15/2022 03/17/17   Street, [place], PA-C  ranitidine (ZANTAC) 150 MG tablet Take 1 tablet (150 mg total) by mouth 2 (two) times daily. Patient not taking: Reported on 08/15/2022 03/17/17   Street, [place], [place]    Physical Exam: Vitals:   08/28/22 2122 08/29/22 0223 08/29/22 0606 08/29/22 0936  BP: (!) 180/109 (!) 195/122 (!) 194/116 (!) 171/115  Pulse: 91 88 81 83  Resp: 18 18 18 17   Temp: 98.2 F (36.8 C) 98.4 F (36.9 C) 98 F (36.7 C) 98 F (36.7 C)  TempSrc: Oral Oral Oral   SpO2: 97% 96% 100% 97%  Weight:      Height:       Physical Exam Vitals and nursing note reviewed.  Constitutional:      General: She is awake. She is not in acute distress.    Appearance: Normal appearance. She is obese.  HENT:     Head: Normocephalic.     Nose: No  rhinorrhea.     Mouth/Throat:     Mouth: Mucous membranes are moist.  Eyes:     General: No scleral icterus.    Pupils: Pupils are equal, round, and reactive to light.  Neck:     Vascular: No JVD.  Cardiovascular:     Rate and Rhythm: Normal rate and regular rhythm.     Heart sounds: S1 normal and S2 normal.  Pulmonary:     Effort: Pulmonary effort is normal.     Breath sounds: Normal breath sounds. No wheezing, rhonchi or rales.  Abdominal:     General: Abdomen is protuberant. Bowel sounds are normal. There is no distension.     Palpations: Abdomen is soft.     Tenderness: There is no abdominal tenderness.  Musculoskeletal:     Cervical back: Neck supple.     Right lower leg: No edema.     Left lower leg: No edema.     Comments: Right knee soft immobilization in place.  Skin:    General: Skin is warm and dry.  Neurological:     General: No focal deficit present.     Mental Status: She is alert and oriented to person, place, and time.  Psychiatric:        Mood and Affect: Mood normal.        Behavior: Behavior normal. Behavior is cooperative.   Data Reviewed:   There are no new results to review at this time.   Family Communication:  Primary team communication:  Thank you very much for involving [place] in the care of your patient.  Author: Bobette Mo, MD 08/29/2022 10:10 AM  For on call review [email or web address].   This document was prepared using Dragon voice recognition software and may contain some unintended transcription errors.

## 2022-08-29 NOTE — Progress Notes (Signed)
Physical Therapy Treatment Patient Details Name: [name] [name] MRN: [id number] DOB: Sep 29, 1960 Today's Date: 08/29/2022   History of Present Illness 62 yo female presents to therapy s/p R THA, anterior approach on 08/28/2022 due to failure of conservative measures. Pt PMH includes but is not limited to: asthma, chronic HA, DDD, and HDL.    PT Comments    Progressing with mobility. BP 185/114 end of PT session. Pt reports she discussed elevated BP with PA this morning. Pain 6/10. Will plan to have a 2nd session today.    Recommendations for follow up therapy are one component of a multi-disciplinary discharge planning process, led by the attending physician.  Recommendations may be updated based on patient status, additional functional criteria and insurance authorization.  Follow Up Recommendations       Assistance Recommended at Discharge Intermittent Supervision/Assistance  Patient can return home with the following A little help with walking and/or transfers;A lot of help with bathing/dressing/bathroom;Assistance with cooking/housework;Assist for transportation;Help with stairs or ramp for entrance   Equipment Recommendations  None recommended by PT    Recommendations for Other Services       Precautions / Restrictions Precautions Precautions: Fall Restrictions Weight Bearing Restrictions: No Other Position/Activity Restrictions: WBAT     Mobility  Bed Mobility Overal bed mobility: Needs Assistance Bed Mobility: Supine to Sit, Sit to Supine     Supine to sit: Min guard, HOB elevated Sit to supine: Min guard, HOB elevated   General bed mobility comments: Increased time. Used gait belt for supine>sit.    Transfers Overall transfer level: Needs assistance Equipment used: Rolling walker (2 wheels) Transfers: Sit to/from Stand Sit to Stand: Min guard           General transfer comment: Min guard for safety.    Ambulation/Gait Ambulation/Gait assistance: Min  guard Gait Distance (Feet): 75 Feet Assistive device: Rolling walker (2 wheels) Gait Pattern/deviations: Step-through pattern, Decreased stride length       General Gait Details: slow steady gait. pt denied dizziness. tolerated distance well.   Stairs             Wheelchair Mobility    Modified Rankin (Stroke Patients Only)       Balance Overall balance assessment: Needs assistance         Standing balance support: Bilateral upper extremity supported, During functional activity, Reliant on assistive device for balance Standing balance-Leahy Scale: Poor                              Cognition Arousal/Alertness: Awake/alert Behavior During Therapy: WFL for tasks assessed/performed Overall Cognitive Status: Within Functional Limits for tasks assessed                                          Exercises Total Joint Exercises Ankle Circles/Pumps: AROM, Both, 10 reps Quad Sets: AROM, 10 reps, Right Heel Slides: AAROM, Right, 10 reps (used gait belt) Hip ABduction/ADduction: AAROM, Right, 10 reps (used gait belt)    General Comments        Pertinent Vitals/Pain Pain Assessment Pain Assessment: 0-10 Pain Score: 6  Pain Location: R LE Pain Descriptors / Indicators: Burning, Tightness, Operative site guarding Pain Intervention(s): Limited activity within patient's tolerance, Monitored during session, Ice applied, Repositioned    Home Living  Prior Function            PT Goals (current goals can now be found in the care plan section) Progress towards PT goals: Progressing toward goals    Frequency    7X/week      PT Plan Current plan remains appropriate    Co-evaluation              AM-PAC PT "6 Clicks" Mobility   Outcome Measure  Help needed turning from your back to your side while in a flat bed without using bedrails?: A Little Help needed moving from lying on your back to  sitting on the side of a flat bed without using bedrails?: A Little Help needed moving to and from a bed to a chair (including a wheelchair)?: A Little Help needed standing up from a chair using your arms (e.g., wheelchair or bedside chair)?: A Little Help needed to walk in hospital room?: A Little Help needed climbing 3-5 steps with a railing? : A Little 6 Click Score: 18    End of Session Equipment Utilized During Treatment: Gait belt Activity Tolerance: Patient tolerated treatment well Patient left: in bed;with call bell/phone within reach   PT Visit Diagnosis: Other abnormalities of gait and mobility (R26.89);Pain Pain - Right/Left: Right Pain - part of body: Hip     Time: 1610-9604 PT Time Calculation (min) (ACUTE ONLY): 18 min  Charges:  $Gait Training: 8-22 mins                         Faye Ramsay, PT Acute Rehabilitation  Office: [phone]

## 2022-08-29 NOTE — Progress Notes (Signed)
Pt. BP: 195/122. Nauseated, antiemetic given.  Levester Fresh, PA notified of elevated BPs. No new orders at this time.

## 2022-08-29 NOTE — TOC Transition Note (Signed)
Transition of Care [facility]) - CM/SW Discharge Note   Patient Details  Name: [name] [name] MRN: [id number] Date of Birth: [birth year]  [facility] CM/SW Contact:  Amada Jupiter, LCSW Phone Number: 08/29/2022, 10:18 AM   Clinical Narrative:     Met with pt and confirming she has needed DME in the home.  OPPT already set up with [facility].  No TOC needs.  Final next level of care: OP Rehab Barriers to Discharge: No Barriers Identified   Patient Goals and CMS Choice      Discharge Placement                         Discharge Plan and Services Additional resources added to the After Visit Summary for                  DME Arranged: N/A DME Agency: NA                  Social Determinants of Health (SDOH) Interventions SDOH Screenings   Food Insecurity: No Food Insecurity (08/28/2022)  Housing: Low Risk  (08/28/2022)  Transportation Needs: No Transportation Needs (08/28/2022)  Utilities: At Risk (08/28/2022)  Tobacco Use: High Risk (08/29/2022)     Readmission Risk Interventions     No data to display

## 2022-08-30 DIAGNOSIS — Z96641 Presence of right artificial hip joint: Secondary | ICD-10-CM | POA: Diagnosis not present

## 2022-08-30 LAB — COMPREHENSIVE METABOLIC PANEL
ALT: 36 U/L (ref 0–44)
AST: 99 U/L — ABNORMAL HIGH (ref 15–41)
Albumin: 3 g/dL — ABNORMAL LOW (ref 3.5–5.0)
Alkaline Phosphatase: 84 U/L (ref 38–126)
Anion gap: 8 (ref 5–15)
BUN: 5 mg/dL — ABNORMAL LOW (ref 8–23)
CO2: 30 mmol/L (ref 22–32)
Calcium: 8.6 mg/dL — ABNORMAL LOW (ref 8.9–10.3)
Chloride: 97 mmol/L — ABNORMAL LOW (ref 98–111)
Creatinine, Ser: 0.6 mg/dL (ref 0.44–1.00)
GFR, Estimated: >60 mL/min (ref >60)
Glucose, Bld: 130 mg/dL — ABNORMAL HIGH (ref 70–99)
Potassium: 2.6 mmol/L — CL (ref 3.5–5.1)
Sodium: 135 mmol/L (ref 135–145)
Total Bilirubin: 1 mg/dL (ref 0.3–1.2)
Total Protein: 6.3 g/dL — ABNORMAL LOW (ref 6.5–8.1)

## 2022-08-30 LAB — CBC
HCT: 37.9 % (ref 36.0–46.0)
Hemoglobin: 13 g/dL (ref 12.0–15.0)
MCH: 38 pg — ABNORMAL HIGH (ref 26.0–34.0)
MCHC: 34.3 g/dL (ref 30.0–36.0)
MCV: 110.8 fL — ABNORMAL HIGH (ref 80.0–100.0)
Platelets: 195 10*3/uL (ref 150–400)
RBC: 3.42 MIL/uL — ABNORMAL LOW (ref 3.87–5.11)
RDW: 13.6 % (ref 11.5–15.5)
WBC: 8.2 10*3/uL (ref 4.0–10.5)
nRBC: 0.2 % (ref 0.0–0.2)

## 2022-08-30 LAB — PHOSPHORUS: Phosphorus: 3.1 mg/dL (ref 2.5–4.6)

## 2022-08-30 LAB — MAGNESIUM: Magnesium: 1.7 mg/dL (ref 1.7–2.4)

## 2022-08-30 MED ORDER — GABAPENTIN 300 MG PO CAPS
300.0000 mg | ORAL_CAPSULE | Freq: Three times a day (TID) | ORAL | Status: DC
  Administered 2022-08-30 – 2022-09-01 (×5): 300 mg via ORAL
  Filled 2022-08-30 (×6): qty 1

## 2022-08-30 MED ORDER — SODIUM CHLORIDE 0.9 % IV SOLN: INTRAVENOUS | Status: DC | PRN

## 2022-08-30 MED ORDER — POTASSIUM CHLORIDE CRYS ER 20 MEQ PO TBCR
40.0000 meq | EXTENDED_RELEASE_TABLET | Freq: Three times a day (TID) | ORAL | Status: AC
  Administered 2022-08-30 (×2): 40 meq via ORAL
  Filled 2022-08-30 (×2): qty 2

## 2022-08-30 MED ORDER — ALPRAZOLAM 0.25 MG PO TABS
0.2500 mg | ORAL_TABLET | Freq: Two times a day (BID) | ORAL | Status: DC
  Administered 2022-08-30 – 2022-09-01 (×4): 0.25 mg via ORAL
  Filled 2022-08-30 (×5): qty 1

## 2022-08-30 MED ORDER — OXYCODONE HCL 5 MG PO TABS
10.0000 mg | ORAL_TABLET | ORAL | Status: DC | PRN
  Administered 2022-08-30 – 2022-09-01 (×6): 15 mg via ORAL
  Filled 2022-08-30 (×6): qty 3

## 2022-08-30 MED ORDER — MAGNESIUM SULFATE 2 GM/50ML IV SOLN
2.0000 g | Freq: Once | INTRAVENOUS | Status: AC
  Administered 2022-08-30: 2 g via INTRAVENOUS
  Filled 2022-08-30: qty 50

## 2022-08-30 MED ORDER — OXYCODONE HCL 5 MG PO TABS: 5.0000 mg | ORAL_TABLET | ORAL | Status: DC | PRN

## 2022-08-30 MED ORDER — HYDROCODONE-ACETAMINOPHEN 10-325 MG PO TABS
1.0000 | ORAL_TABLET | Freq: Four times a day (QID) | ORAL | Status: DC
  Administered 2022-08-30 – 2022-09-01 (×6): 1 via ORAL
  Filled 2022-08-30 (×8): qty 1

## 2022-08-30 MED ORDER — AMLODIPINE BESYLATE 5 MG PO TABS
5.0000 mg | ORAL_TABLET | Freq: Every day | ORAL | Status: DC
  Administered 2022-08-30 – 2022-09-01 (×3): 5 mg via ORAL
  Filled 2022-08-30 (×3): qty 1

## 2022-08-30 MED ORDER — CARVEDILOL 12.5 MG PO TABS
12.5000 mg | ORAL_TABLET | Freq: Two times a day (BID) | ORAL | Status: DC
  Administered 2022-08-30 – 2022-09-01 (×4): 12.5 mg via ORAL
  Filled 2022-08-30 (×4): qty 1

## 2022-08-30 NOTE — Progress Notes (Addendum)
Subjective: Patient reports pain as moderate to severe. Worse with movement. Tolerating diet. No BM. Often has GI issues at home with severe diarrhea. Urinating.   No CP, SOB, blurry vision, HA, palpitations.  Has mobilized OOB with PT well but limited by pain.   Having continued issues with elevated BP post-op. Reports this happened last year after spine surgery too. Says it took trials of different medicines to find what worked best. Reports feeling anxious about this elevated BP and low K this morning.   Said that sometimes when at home she has episodes of easily becoming fatigued and SOB with even minimal exertion. Admits she has passed out at home more than once. I just looked back at her vitals taken during our pre-op office visit and her BP was 170/103 at that time. It may be that her pressure hasn't been well controlled for weeks to months.    Objective:   VITALS:   Vitals:   08/29/22 1700 08/29/22 2128 08/30/22 0610 08/30/22 1328  BP: (!) 199/121 (!) 188/112 (!) 169/123 (!) 152/98  Pulse: 87 (!) 101 (!) 105 87  Resp:  19 16   Temp:  98.6 F (37 C) 98.2 F (36.8 C)   TempSrc:      SpO2:  97% 96%   Weight:      Height:          Latest Ref Rng & Units 08/30/2022    9:05 AM 06/20/2017    3:34 PM 03/17/2017   10:23 AM  CBC  WBC 4.0 - 10.5 K/uL 8.2  6.6  6.9   Hemoglobin 12.0 - 15.0 g/dL 16.1  09.6  04.5   Hematocrit 36.0 - 46.0 % 37.9  44.1  47.3   Platelets 150 - 400 K/uL 195  235  253       Latest Ref Rng & Units 08/30/2022    9:05 AM 06/20/2017    3:34 PM 03/17/2017   10:23 AM  BMP  Glucose 70 - 99 mg/dL 409  96  811   BUN 8 - 23 mg/dL 5  37  22   Creatinine 0.44 - 1.00 mg/dL 9.14  7.82  9.56   Sodium 135 - 145 mmol/L 135  137  136   Potassium 3.5 - 5.1 mmol/L 2.6  4.1  4.1   Chloride 98 - 111 mmol/L 97  98  103   CO2 22 - 32 mmol/L 30  26  25    Calcium 8.9 - 10.3 mg/dL 8.6  9.4  9.3    Intake/Output      06/12 0701 06/13 0700 06/13 0701 06/14 0700   P.O.  720 240   I.V. (mL/kg)  1.8 (0)   IV Piggyback  29.9   Total Intake(mL/kg) 720 (7.7) 271.7 (2.9)   Urine (mL/kg/hr) 1725 (0.8) 300 (0.3)   Stool  0   Blood     Total Output 1725 300   Net -1005 -28.3        Urine Occurrence 1 x    Stool Occurrence  0 x      Physical Exam: General: NAD. Getting back in bed after doing some work with PT/OT Resp: No increased wob Cardio: tachy rate and regular rhythm ABD soft Neurologically intact MSK Neurovascularly intact Sensation intact distally Intact pulses distally Dorsiflexion/Plantar flexion intact Incision: dressing C/D/I   Assessment: 2 Days Post-Op  S/P Procedure(s) (LRB): TOTAL HIP ARTHROPLASTY ANTERIOR APPROACH (Right) by Dr. Jewel Baize. Eulah Pont on 08/28/22  Principal Problem:   S/P total right hip arthroplasty Active Problems:   Tobacco use disorder   Chronic back pain   Familial hypercholesterolemia   GAD (generalized anxiety disorder)   Class 2 obesity   Hypertensive urgency   ETOH abuse   Glucose intolerance   Vomiting and diarrhea   Plan: Appreciate hospitalist help for hypertensive urgency/crisis management  They are also trying to help her anxiety  She is a chronic pain patient on daily Vicodin 10-325mg  q6h as well as Gabapentin. She hasn't been getting either of these since surgery so I will add them to her medicines and see if getting her chronic pain controlled helps [place] with her acute post-op pain management. I will keep Oxy PRN for the acute pain. Vicodin for chronic pain.   Advance diet Up with therapy Incentive Spirometry Elevate and Apply ice  Weightbearing: WBAT RLE Insicional and dressing care: Dressings left intact until follow-up and Reinforce dressings as needed Orthopedic device(s): None Showering: Keep dressing dry VTE prophylaxis: Aspirin 81mg  BID  x 30 days postop , SCDs, ambulation Pain control: PRN Follow - up plan: 2 weeks Contact information:  Margarita Rana MD, Levester Fresh  PA-C  Dispo: Home once pain controlled, passes PT, and medically cleared by hospitalist team.  Her son came into the room and asked about her having HHPT instead of OPPT. I explained the reasoning why we prefer OPPT after total joint replacements. Due to her BP issues during this hospitalization, we may consider some HHPT however.      Jenne Pane, PA-C Office [phone] 08/30/2022, 4:10 PM

## 2022-08-30 NOTE — Progress Notes (Signed)
Physical Therapy Treatment Patient Details Name: [name] [name] MRN: [id number] DOB: [birth year] Today's Date: 08/30/2022   History of Present Illness 62 yo female presents to therapy s/p R THA, anterior approach on 08/28/2022 due to failure of conservative measures. Pt PMH includes but is not limited to: asthma, chronic HA, DDD, and HDL.    PT Comments    Pt ambulated short distance in hallway which was limited by pain.  Pt reports she performed her exercises earlier and able to verbalize supine exercises.  Pt requested to rest after ambulating and RN in room giving meds.    Recommendations for follow up therapy are one component of a multi-disciplinary discharge planning process, led by the attending physician.  Recommendations may be updated based on patient status, additional functional criteria and insurance authorization.  Follow Up Recommendations       Assistance Recommended at Discharge Intermittent Supervision/Assistance  Patient can return home with the following A little help with walking and/or transfers;A lot of help with bathing/dressing/bathroom;Assistance with cooking/housework;Assist for transportation;Help with stairs or ramp for entrance   Equipment Recommendations  None recommended by PT    Recommendations for Other Services       Precautions / Restrictions Precautions Precautions: Fall Restrictions Other Position/Activity Restrictions: WBAT     Mobility  Bed Mobility Overal bed mobility: Needs Assistance Bed Mobility: Supine to Sit, Sit to Supine     Supine to sit: Min guard, HOB elevated Sit to supine: Min guard, HOB elevated   General bed mobility comments: Increased time and effort, utilized gait belt to self assist R LE    Transfers Overall transfer level: Needs assistance Equipment used: Rolling walker (2 wheels) Transfers: Sit to/from Stand Sit to Stand: Min guard           General transfer comment: Min guard for safety.     Ambulation/Gait Ambulation/Gait assistance: Min guard Gait Distance (Feet): 40 Feet Assistive device: Rolling walker (2 wheels) Gait Pattern/deviations: Step-through pattern, Decreased stride length, Antalgic Gait velocity: decreased     General Gait Details: slow but steady with RW, pt reports pain limiting, denies dizziness   Stairs             Wheelchair Mobility    Modified Rankin (Stroke Patients Only)       Balance                                            Cognition Arousal/Alertness: Awake/alert Behavior During Therapy: WFL for tasks assessed/performed Overall Cognitive Status: Within Functional Limits for tasks assessed                                          Exercises      General Comments        Pertinent Vitals/Pain Pain Assessment Pain Assessment: 0-10 Pain Score: 7  Pain Location: R hip/thigh Pain Descriptors / Indicators: Sore, Aching, Guarding, Grimacing Pain Intervention(s): Repositioned, Monitored during session, RN gave pain meds during session    Home Living                          Prior Function            PT Goals (current goals can now be found  in the care plan section) Progress towards PT goals: Progressing toward goals    Frequency    7X/week      PT Plan Current plan remains appropriate    Co-evaluation              AM-PAC PT "6 Clicks" Mobility   Outcome Measure  Help needed turning from your back to your side while in a flat bed without using bedrails?: A Little Help needed moving from lying on your back to sitting on the side of a flat bed without using bedrails?: A Little Help needed moving to and from a bed to a chair (including a wheelchair)?: A Little Help needed standing up from a chair using your arms (e.g., wheelchair or bedside chair)?: A Little Help needed to walk in hospital room?: A Little Help needed climbing 3-5 steps with a railing? : A  Lot 6 Click Score: 17    End of Session Equipment Utilized During Treatment: Gait belt Activity Tolerance: Patient tolerated treatment well Patient left: in bed;with call bell/phone within reach;with nursing/sitter in room   PT Visit Diagnosis: Other abnormalities of gait and mobility (R26.89);Pain Pain - Right/Left: Right Pain - part of body: Hip     Time: 6045-4098 PT Time Calculation (min) (ACUTE ONLY): 9 min  Charges:  $Gait Training: 8-22 mins                    Paulino Door, DPT Physical Therapist Acute Rehabilitation Services Office: [phone]    [name] [name] 08/30/2022, 3:11 PM

## 2022-08-30 NOTE — Progress Notes (Signed)
[facility] admitting team consult addendum:  The nursing staff reported that the patient's potassium level was 2.6 mmol/L.  Her magnesium level was 1.7 mg/dL.  Oral KCl replacement and magnesium sulfate 2 g IVPB was ordered.  The patient will be followed by [facility] team 2  today.  Sanda Klein, MD

## 2022-08-30 NOTE — Progress Notes (Signed)
Date and time results received: 08/30/22 0956 (use smartphrase ".now" to insert current time)  Test: Potassium  Critical Value: 2.6  Name of Provider Notified: Robb Matar  Orders Received? Or Actions Taken?: Dr Robb Matar ordered K+ and Mg supplementation and informed RN that hospitalist assigned to this patient is actually Dahal but he would still order so there was no delay in care.

## 2022-08-30 NOTE — Progress Notes (Signed)
PROGRESS NOTE  [name] [name]  DOB: September 18, 1960  PCP: Lauretta Grill, FNP ZOX:[id number]  DOA: 08/28/2022  LOS: 1 day  Hospital Day: 3  Brief narrative: [name] [name] is a 62 y.o. female with PMH significant for HTN, HLD, asthma, chronic headache, degenerative disc disease, anxiety/depression. 6/11, patient underwent elective right TKA for osteoarthritis.  She was monitored overnight.  She was noted to have significantly elevated blood pressure as high as 195/122. 6/12, hospitalist service was consulted for uncontrolled hypertension.  Subjective: Patient was seen and examined this morning.  Pleasant middle-aged Caucasian female.  Lying on bed.  Not in distress.  She states that she does not monitor her blood pressure at home.   Assessment and plan: Hypertensive urgency Patient has history of hypertension and was on Coreg 6.25 mg twice daily at home.   6/11, postop, she had significant rise in blood pressure over 190 systolic.  As needed IV labetalol and IV hydralazine were tried. Patient reports that she had similar rise in blood pressure postop after her back surgery in the past.  Her cardiologist then increased carvedilol to 6.25 mg twice daily.  In the past she was also on lisinopril but she was very sensitive to it, her blood pressure dropped making her elevated hence it was stopped. Patient does not monitor her blood pressure at home.  There is a fair chance that her blood pressure is elevated at baseline and might have worsened perioperatively. Currently she is continued on carvedilol 6.25 mg twice daily.  Also added hydralazine 50 mg every 4 hours as needed.  She has gotten it twice in the last 12 hours. This morning, heart rate over 100, blood pressure 169/123. I added amlodipine 5 mg daily this morning.  I will increase Coreg to 12.5 mg twice daily from tonight. Continue to monitor blood pressure  Chronic alcohol use Patient reports drinking 2 ounces of liquor nightly before  bed.  Last drink was on the night of 6/10.   At the time of my exam, patient is alert, awake, does not have tremors.  Not showing any symptoms of withdrawal.  Continue to monitor Counseled to quit alcohol.  Hypokalemia Potassium level significantly low at 2.6.  Oral replacement ordered. Recent Labs  Lab 08/30/22 0905  K 2.6*  MG 1.7  PHOS 3.1   Familial hypercholesterolemia Continue atorvastatin 40 mg p.o. daily.  Osteoarthritis S/P right TKA  Continue postop care per orthopedic surgery   Tobacco use disorder Smokes 1 pack/day.  Declined nicotine replacement therapy at this moment.   Chronic back pain Analgesics and muscle relaxants as needed.   GAD (generalized anxiety disorder) Trazodone as needed for insomnia while in the hospital. It seems that patient's uncontrolled anxiety is the cause of her dependence to smoking and alcohol. I started her on Xanax 0.25 mg twice daily today.  Morbid Obesity  Body mass index is 37.86 kg/m. Patient has been advised to make an attempt to improve diet and exercise patterns to aid in weight loss.  Constipation Bowel regimen   Mobility: Encourage ambulation.  Seen by PT.  Home health recommended  Goals of care   Code Status: Full Code     DVT prophylaxis:  SCDs Start: 08/28/22 1653 Place TED hose Start: 08/28/22 1653   Antimicrobials: None Fluid: None Family Communication: None at bedside  Status: Inpatient Level of care:  Med-Surg   Patient from: Home Anticipated d/c to: Home with home health Needs to continue in-hospital care:  Blood pressure  still running elevated.  If better controlled, possible discharge tomorrow   Diet:  Diet Order             Diet regular Fluid consistency: Thin  Diet effective now           Diet - low sodium heart healthy                   Scheduled Meds:  ALPRAZolam  0.25 mg Oral BID   amLODipine  5 mg Oral Daily   aspirin  81 mg Oral BID   atorvastatin  10 mg Oral Daily    carvedilol  12.5 mg Oral BID WC   dicyclomine  10 mg Oral TID AC & HS   docusate sodium  100 mg Oral BID   pantoprazole  40 mg Oral Daily   potassium chloride  40 mEq Oral TID   traMADol  50 mg Oral Q6H    PRN meds: sodium chloride, acetaminophen, albuterol, alum & mag hydroxide-simeth, bisacodyl, diphenhydrAMINE, hydrALAZINE, HYDROmorphone (DILAUDID) injection, magnesium citrate, menthol-cetylpyridinium **OR** phenol, methocarbamol **OR** methocarbamol (ROBAXIN) IV, metoCLOPramide **OR** metoCLOPramide (REGLAN) injection, ondansetron **OR** ondansetron (ZOFRAN) IV, oxyCODONE, oxyCODONE, polyethylene glycol, traZODone   Infusions:   sodium chloride 10 mL/hr at 08/30/22 1049   methocarbamol (ROBAXIN) IV 500 mg (08/28/22 1421)    Antimicrobials: Anti-infectives (From admission, onward)    Start     Dose/Rate Route Frequency Ordered Stop   08/28/22 1430  ceFAZolin (ANCEF) IVPB 2g/100 mL premix        2 g 200 mL/hr over 30 Minutes Intravenous Every 6 hours 08/28/22 1418 08/29/22 0229   08/28/22 1045  ceFAZolin (ANCEF) IVPB 2g/100 mL premix        2 g 200 mL/hr over 30 Minutes Intravenous On call to O.R. 08/28/22 1032 08/28/22 1252       Nutritional status:  Body mass index is 37.86 kg/m.          Objective: Vitals:   08/29/22 2128 08/30/22 0610  BP: (!) 188/112 (!) 169/123  Pulse: (!) 101 (!) 105  Resp: 19 16  Temp: 98.6 F (37 C) 98.2 F (36.8 C)  SpO2: 97% 96%    Intake/Output Summary (Last 24 hours) at 08/30/2022 1245 Last data filed at 08/30/2022 1239 Gross per 24 hour  Intake 600 ml  Output 1625 ml  Net -1025 ml   Filed Weights   08/28/22 1653  Weight: 93.9 kg   Weight change:  Body mass index is 37.86 kg/m.   Physical Exam: General exam: Pleasant, not in distress at rest Skin: No rashes, lesions or ulcers. HEENT: Atraumatic, normocephalic, no obvious bleeding Lungs: Clear to auscultation bilaterally CVS: Regular rate and rhythm, no murmur GI/Abd  soft, nontender, nondistended, bowel sound present CNS: Alert, awake, oriented x 3 Psychiatry: Mood appropriate Extremities: No pedal edema, no complaints  Data Review: I have personally reviewed the laboratory data and studies available.  F/u labs ordered Unresulted Labs (From admission, onward)    None       Total time spent in review of labs and imaging, patient evaluation, formulation of plan, documentation and communication with family: 55 minutes  Signed, Lorin Glass, MD [facility] 08/30/2022

## 2022-08-30 NOTE — Plan of Care (Signed)
  Problem: Activity: Goal: Ability to avoid complications of mobility impairment will improve Outcome: Adequate for Discharge Goal: Ability to tolerate increased activity will improve Outcome: Adequate for Discharge   Problem: Clinical Measurements: Goal: Diagnostic test results will improve Outcome: Progressing Goal: Cardiovascular complication will be avoided Outcome: Progressing   Problem: Elimination: Goal: Will not experience complications related to bowel motility Outcome: Progressing

## 2022-08-30 NOTE — TOC Progression Note (Signed)
Transition of Care [facility]) - Progression Note   Patient Details  Name: [name] [name] MRN: [id number] Date of Birth: 1961/01/05  [facility] CM/SW Contact  Ewing Schlein, LCSW Phone Number: 08/30/2022, 10:43 AM  Clinical Narrative: SDOH indicated need for utility assistance. CSW met with patient, who reported she does not have any issues with paying for utilities, so SDOH screening was updated.  Barriers to Discharge: No Barriers Identified  Expected Discharge Plan and Services Expected Discharge Date: 08/28/22               DME Arranged: N/A DME Agency: NA  Social Determinants of Health (SDOH) Interventions SDOH Screenings   Food Insecurity: No Food Insecurity (08/28/2022)  Housing: Low Risk  (08/28/2022)  Transportation Needs: No Transportation Needs (08/28/2022)  Utilities: Not At Risk (08/30/2022)  Recent Concern: Utilities - At Risk (08/28/2022)  Tobacco Use: High Risk (08/29/2022)   Readmission Risk Interventions     No data to display

## 2022-08-30 NOTE — Progress Notes (Signed)
Physical Therapy Treatment Patient Details Name: [name] [name] MRN: [id number] DOB: [birth year] Today's Date: 08/30/2022   History of Present Illness 62 yo female presents to therapy s/p R THA, anterior approach on 08/28/2022 due to failure of conservative measures. Pt PMH includes but is not limited to: asthma, chronic HA, DDD, and HDL.    PT Comments    Pt assisted to bathroom and then ambulated in hallway, able to progress distance a little however still reports pain limiting.  Pt anticipates d/c home tomorrow and declined practicing one step (to enter home) today, prefers tomorrow.   Ortho PA and RN in room end of session.   Recommendations for follow up therapy are one component of a multi-disciplinary discharge planning process, led by the attending physician.  Recommendations may be updated based on patient status, additional functional criteria and insurance authorization.  Follow Up Recommendations       Assistance Recommended at Discharge Intermittent Supervision/Assistance  Patient can return home with the following A little help with walking and/or transfers;A lot of help with bathing/dressing/bathroom;Assistance with cooking/housework;Assist for transportation;Help with stairs or ramp for entrance   Equipment Recommendations  None recommended by PT    Recommendations for Other Services       Precautions / Restrictions Precautions Precautions: Fall Restrictions Other Position/Activity Restrictions: WBAT     Mobility  Bed Mobility Overal bed mobility: Needs Assistance Bed Mobility: Supine to Sit, Sit to Supine     Supine to sit: Min guard, HOB elevated Sit to supine: Min guard, HOB elevated   General bed mobility comments: Increased time and effort, utilized gait belt to self assist R LE    Transfers Overall transfer level: Needs assistance Equipment used: Rolling walker (2 wheels) Transfers: Sit to/from Stand Sit to Stand: Min guard           General  transfer comment: Min guard for safety.    Ambulation/Gait Ambulation/Gait assistance: Min guard Gait Distance (Feet): 80 Feet Assistive device: Rolling walker (2 wheels) Gait Pattern/deviations: Step-through pattern, Decreased stride length, Antalgic Gait velocity: decreased     General Gait Details: slow but steady with RW, pt reports pain limiting however premedicated for session, denies dizziness   Stairs             Wheelchair Mobility    Modified Rankin (Stroke Patients Only)       Balance                                            Cognition Arousal/Alertness: Awake/alert Behavior During Therapy: WFL for tasks assessed/performed, Flat affect Overall Cognitive Status: Within Functional Limits for tasks assessed                                          Exercises      General Comments        Pertinent Vitals/Pain Pain Assessment Pain Assessment: 0-10 Pain Score: 7  Pain Location: R hip/thigh Pain Descriptors / Indicators: Sore, Aching, Guarding, Grimacing Pain Intervention(s): Repositioned, Monitored during session, Premedicated before session (with movement, very little pain at rest)    Home Living                          Prior Function  PT Goals (current goals can now be found in the care plan section) Progress towards PT goals: Progressing toward goals    Frequency    7X/week      PT Plan Current plan remains appropriate    Co-evaluation              AM-PAC PT "6 Clicks" Mobility   Outcome Measure  Help needed turning from your back to your side while in a flat bed without using bedrails?: A Little Help needed moving from lying on your back to sitting on the side of a flat bed without using bedrails?: A Little Help needed moving to and from a bed to a chair (including a wheelchair)?: A Little Help needed standing up from a chair using your arms (e.g., wheelchair or  bedside chair)?: A Little Help needed to walk in hospital room?: A Little Help needed climbing 3-5 steps with a railing? : A Little 6 Click Score: 18    End of Session Equipment Utilized During Treatment: Gait belt Activity Tolerance: Patient tolerated treatment well Patient left: in bed;with call bell/phone within reach;with nursing/sitter in room Nurse Communication: Mobility status PT Visit Diagnosis: Other abnormalities of gait and mobility (R26.89);Pain Pain - Right/Left: Right Pain - part of body: Hip     Time: 1610-9604 PT Time Calculation (min) (ACUTE ONLY): 22 min  Charges:  $Gait Training: 8-22 mins                     Paulino Door, DPT Physical Therapist Acute Rehabilitation Services Office: [phone]    [name] [name] 08/30/2022, 4:06 PM

## 2022-08-31 DIAGNOSIS — Z96641 Presence of right artificial hip joint: Secondary | ICD-10-CM | POA: Diagnosis not present

## 2022-08-31 LAB — BASIC METABOLIC PANEL
Anion gap: 11 (ref 5–15)
BUN: 8 mg/dL (ref 8–23)
CO2: 29 mmol/L (ref 22–32)
Calcium: 8.6 mg/dL — ABNORMAL LOW (ref 8.9–10.3)
Chloride: 94 mmol/L — ABNORMAL LOW (ref 98–111)
Creatinine, Ser: 0.77 mg/dL (ref 0.44–1.00)
GFR, Estimated: >60 mL/min (ref >60)
Glucose, Bld: 116 mg/dL — ABNORMAL HIGH (ref 70–99)
Potassium: 3 mmol/L — ABNORMAL LOW (ref 3.5–5.1)
Sodium: 134 mmol/L — ABNORMAL LOW (ref 135–145)

## 2022-08-31 MED ORDER — POTASSIUM CHLORIDE CRYS ER 20 MEQ PO TBCR
40.0000 meq | EXTENDED_RELEASE_TABLET | ORAL | Status: AC
  Administered 2022-08-31 (×2): 40 meq via ORAL
  Filled 2022-08-31 (×2): qty 2

## 2022-08-31 NOTE — Progress Notes (Signed)
Physical Therapy Treatment Patient Details Name: [name] [name] MRN: [id number] DOB: 29-Jun-1960 Today's Date: 08/31/2022   History of Present Illness 62 yo female presents to therapy s/p R THA, anterior approach on 08/28/2022 due to failure of conservative measures. Pt PMH includes but is not limited to: asthma, chronic HA, DDD, and HDL.    PT Comments    Pt ambulated in hallway short distance and performed LE exercises.  Pt reports pain 8-9/10 in Right hip/thigh with mobilizing and exercises.  Pt reports concern over d/c home alone and worried about pain control at this time.  Pt did not require physical assist today and able to self assist Rt LE using gait belt for bed mobility and with exercises.  Pt provided with HEP handout and per notes, to have HHPT follow up.  Pt has practiced one step to enter home and plans to remain on main level while recovering.     Recommendations for follow up therapy are one component of a multi-disciplinary discharge planning process, led by the attending physician.  Recommendations may be updated based on patient status, additional functional criteria and insurance authorization.  Follow Up Recommendations       Assistance Recommended at Discharge Intermittent Supervision/Assistance  Patient can return home with the following A little help with walking and/or transfers;Assistance with cooking/housework;Assist for transportation;Help with stairs or ramp for entrance;A little help with bathing/dressing/bathroom   Equipment Recommendations  None recommended by PT    Recommendations for Other Services       Precautions / Restrictions Precautions Precautions: Fall Restrictions Other Position/Activity Restrictions: WBAT     Mobility  Bed Mobility Overal bed mobility: Needs Assistance Bed Mobility: Supine to Sit, Sit to Supine     Supine to sit: Min guard, HOB elevated Sit to supine: Min guard, HOB elevated   General bed mobility comments:  Increased time and effort, utilized gait belt to self assist R LE    Transfers Overall transfer level: Needs assistance Equipment used: Rolling walker (2 wheels) Transfers: Sit to/from Stand Sit to Stand: Min guard Stand pivot transfers: Min guard         General transfer comment: Min guard for safety.    Ambulation/Gait Ambulation/Gait assistance: Min guard Gait Distance (Feet): 40 Feet Assistive device: Rolling walker (2 wheels) Gait Pattern/deviations: Step-through pattern, Decreased stride length, Antalgic, Step-to pattern Gait velocity: decreased     General Gait Details: slow but steady with RW, cues for step length for pain control   Stairs   Wheelchair Mobility    Modified Rankin (Stroke Patients Only)       Balance                                            Cognition Arousal/Alertness: Awake/alert Behavior During Therapy: WFL for tasks assessed/performed, Flat affect Overall Cognitive Status: Within Functional Limits for tasks assessed                                          Exercises Total Joint Exercises Ankle Circles/Pumps: AROM, Both, 10 reps Quad Sets: AROM, 10 reps, Right Short Arc Quad: AROM, Right, 10 reps Heel Slides: AAROM, Right, 10 reps Hip ABduction/ADduction: AAROM, Right, 10 reps Long Arc Quad: AROM, Seated, Right, 10 reps    General  Comments        Pertinent Vitals/Pain Pain Assessment Pain Assessment: 0-10 Pain Score: 9  Pain Location: R hip/thigh Pain Descriptors / Indicators: Sore, Aching, Guarding, Grimacing Pain Intervention(s): Monitored during session, Repositioned, Ice applied    Home Living                          Prior Function            PT Goals (current goals can now be found in the care plan section) Progress towards PT goals: Progressing toward goals    Frequency    7X/week      PT Plan Current plan remains appropriate    Co-evaluation               AM-PAC PT "6 Clicks" Mobility   Outcome Measure  Help needed turning from your back to your side while in a flat bed without using bedrails?: A Little Help needed moving from lying on your back to sitting on the side of a flat bed without using bedrails?: A Little Help needed moving to and from a bed to a chair (including a wheelchair)?: A Little Help needed standing up from a chair using your arms (e.g., wheelchair or bedside chair)?: A Little Help needed to walk in hospital room?: A Little Help needed climbing 3-5 steps with a railing? : A Little 6 Click Score: 18    End of Session Equipment Utilized During Treatment: Gait belt Activity Tolerance: Patient tolerated treatment well Patient left: in bed;with call bell/phone within reach;with family/visitor present Nurse Communication: Mobility status PT Visit Diagnosis: Other abnormalities of gait and mobility (R26.89);Pain Pain - Right/Left: Right Pain - part of body: Hip     Time: 1333-1405 PT Time Calculation (min) (ACUTE ONLY): 32 min  Charges:  $Gait Training: 8-22 mins $Therapeutic Exercise: 8-22 mins                    Paulino Door, DPT Physical Therapist Acute Rehabilitation Services Office: [phone]    [name] [name] Payson 08/31/2022, 2:26 PM

## 2022-08-31 NOTE — Progress Notes (Signed)
Subjective: Patient continues to report pain as severe. She is on chronic pain medicines daily so not surprised it is hard to manage this new acute pain. I adjusted her medicines yesterday so she is now getting exactly what she would get at home as far as chronic pain medicines go. Records indicate she is still needed Oxy 15mg  on top of that while inpatient.  BP trending down finally. Likely has been poorly controlled for weeks to months. Tolerating diet. Often has GI issues at home with severe diarrhea. Urinating. No CP, SOB. Has mobilized well OOB with PT/OT.   Concerned about going home. Wanted to go to a SNF but I explained why we do not send patients there after joint replacement surgery. Would rather patient go home with HHPT than go to a SNF. When she met with multiple staff members in our office on more than 1 occasion before surgery, she listed no concerns what so ever with her post-op support.   Objective:   VITALS:   Vitals:   08/30/22 1328 08/30/22 2052 08/31/22 0534 08/31/22 1357  BP: (!) 152/98 (!) 139/92 (!) 155/91 129/85  Pulse: 87 72 93 80  Resp:  18 18 18   Temp:  98.1 F (36.7 C) 98.8 F (37.1 C) 98.9 F (37.2 C)  TempSrc:  Oral Oral Oral  SpO2:  96% 93% 90%  Weight:      Height:          Latest Ref Rng & Units 08/30/2022    9:05 AM 06/20/2017    3:34 PM 03/17/2017   10:23 AM  CBC  WBC 4.0 - 10.5 K/uL 8.2  6.6  6.9   Hemoglobin 12.0 - 15.0 g/dL 16.1  09.6  04.5   Hematocrit 36.0 - 46.0 % 37.9  44.1  47.3   Platelets 150 - 400 K/uL 195  235  253       Latest Ref Rng & Units 08/31/2022    7:48 AM 08/30/2022    9:05 AM 06/20/2017    3:34 PM  BMP  Glucose 70 - 99 mg/dL 409  811  96   BUN 8 - 23 mg/dL 8  5  37   Creatinine 9.14 - 1.00 mg/dL 7.82  9.56  2.13   Sodium 135 - 145 mmol/L 134  135  137   Potassium 3.5 - 5.1 mmol/L 3.0  2.6  4.1   Chloride 98 - 111 mmol/L 94  97  98   CO2 22 - 32 mmol/L 29  30  26    Calcium 8.9 - 10.3 mg/dL 8.6  8.6  9.4     Intake/Output      06/13 0701 06/14 0700 06/14 0701 06/15 0700   P.O. 660 240   I.V. (mL/kg) 1.8 (0)    IV Piggyback 29.9    Total Intake(mL/kg) 691.7 (7.4) 240 (2.6)   Urine (mL/kg/hr) 600 (0.3) 350 (0.4)   Stool 0 0   Total Output 600 350   Net +91.7 -110        Urine Occurrence  4 x   Stool Occurrence 0 x 2 x      Physical Exam: General: NAD.  Laying in bed, calm Resp: No increased wob Cardio: regular rate and rhythm ABD soft Neurologically intact MSK Neurovascularly intact Sensation intact distally Intact pulses distally Dorsiflexion/Plantar flexion intact Incision: dressing C/D/I   Assessment: 3 Days Post-Op  S/P Procedure(s) (LRB): TOTAL HIP ARTHROPLASTY ANTERIOR APPROACH (Right) by Dr. Jewel Baize. Eulah Pont  on 08/28/22  Principal Problem:   S/P total right hip arthroplasty Active Problems:   Tobacco use disorder   Chronic back pain   Familial hypercholesterolemia   GAD (generalized anxiety disorder)   Class 2 obesity   Hypertensive urgency   ETOH abuse   Glucose intolerance   Vomiting and diarrhea   Plan: Appreciate hospitalist help for hypertensive urgency/crisis management. Will send her home on current regimen of Coreg 12.5mg  bid and Amlodipine 5mg  daily  They are also trying to help her anxiety   Advance diet Up with therapy Incentive Spirometry Elevate and Apply ice  Weightbearing: WBAT RLE Insicional and dressing care: Dressings left intact until follow-up and Reinforce dressings as needed Orthopedic device(s): None Showering: Keep dressing dry VTE prophylaxis: Aspirin 81mg  BID  x 30 days post-op , SCDs, ambulation Pain control: PRN Follow - up plan: 2 weeks Contact information:  Margarita Rana MD, Levester Fresh PA-C  Dispo: Home with HHPT since she is concerned about transportation to OPPT.      Jenne Pane, PA-C Office [phone] 08/31/2022, 3:23 PM

## 2022-08-31 NOTE — Progress Notes (Signed)
PROGRESS NOTE  [name] [name]  DOB: 1961-02-13  PCP: Lauretta Grill, FNP ZOX:[id number]  DOA: 08/28/2022  LOS: 2 days  Hospital Day: 4  Brief narrative: [name] [name] is a 62 y.o. female with PMH significant for HTN, HLD, asthma, chronic headache, degenerative disc disease, anxiety/depression. 6/11, patient underwent elective right TKA for osteoarthritis.  She was monitored overnight.  She was noted to have significantly elevated blood pressure as high as 195/122. 6/12, hospitalist service was consulted for uncontrolled hypertension.  Subjective: Patient was seen and examined this morning.  Lying on bed.  She was just back from physical therapy.  Seems to be in pain and she is expecting to go to SNF.  I would defer it to PT and orthopedics team. Blood pressure is to be gradually improving.  Assessment and plan: Hypertensive urgency Patient has history of hypertension and was on Coreg 6.25 mg twice daily at home.   6/11, postop, she had significant rise in blood pressure over 190 systolic.  As needed IV labetalol and IV hydralazine were tried. Patient reports that she had similar rise in blood pressure postop after her back surgery in the past.  Her cardiologist then increased carvedilol to 6.25 mg twice daily.  In the past she was also on lisinopril but she was very sensitive to it, her blood pressure dropped making her elevated hence it was stopped. Patient does not monitor her blood pressure at home.  There is a fair chance that her blood pressure is elevated at baseline and might have worsened perioperatively. Blood pressure medicine adjusted.  Carvedilol was increased to 12.5 mg twice daily.  Amlodipine 5 mg daily was added.  Blood pressure gradually improving on this regimen.  At discharge, I would continue this regimen. Monitor blood pressure at home. Follow-up PCP as an outpatient for further assessment.  Chronic alcohol use Patient reports drinking 2 ounces of liquor nightly  before bed.  Last drink was on the night of 6/10.  No withdrawal symptoms noted in the hospital. Counseled to quit alcohol.  Hypokalemia Potassium level low.  Trend as below.  Replacement given. Recent Labs  Lab 08/30/22 0905 08/31/22 0748  K 2.6* 3.0*  MG 1.7  --   PHOS 3.1  --     Familial hypercholesterolemia Continue atorvastatin 40 mg p.o. daily.  Osteoarthritis S/P right TKA  per orthopedic surgery PT eval obtained.  Outpatient PT recommended.   Chronic back pain Analgesics and muscle relaxants as needed.  Tobacco use disorder Smokes 1 pack/day.  Declined nicotine replacement therapy at this moment.   GAD (generalized anxiety disorder) Trazodone as needed for insomnia while in the hospital. It seems that patient's uncontrolled anxiety is the cause of her dependence to smoking and alcohol. She has been started on Xanax 0.25 mg twice daily.  Morbid Obesity  Body mass index is 37.86 kg/m. Patient has been advised to make an attempt to improve diet and exercise patterns to aid in weight loss.  Constipation Bowel regimen to continue  Goals of care   Code Status: Full Code     DVT prophylaxis:  SCDs Start: 08/28/22 1653 Place TED hose Start: 08/28/22 1653   Antimicrobials: None Fluid: None Family Communication: None at bedside  Status: Inpatient Level of care:  Med-Surg   Patient from: Home Anticipated d/c to: Home with home health.  Okay to discharge today from medicine standpoint   Diet:  Diet Order  Diet regular Fluid consistency: Thin  Diet effective now           Diet - low sodium heart healthy                   Scheduled Meds:  ALPRAZolam  0.25 mg Oral BID   amLODipine  5 mg Oral Daily   aspirin  81 mg Oral BID   atorvastatin  10 mg Oral Daily   carvedilol  12.5 mg Oral BID WC   dicyclomine  10 mg Oral TID AC & HS   docusate sodium  100 mg Oral BID   gabapentin  300 mg Oral TID   HYDROcodone-acetaminophen  1 tablet  Oral Q6H   pantoprazole  40 mg Oral Daily   potassium chloride  40 mEq Oral Q2H    PRN meds: sodium chloride, acetaminophen, albuterol, alum & mag hydroxide-simeth, bisacodyl, diphenhydrAMINE, hydrALAZINE, HYDROmorphone (DILAUDID) injection, magnesium citrate, menthol-cetylpyridinium **OR** phenol, methocarbamol **OR** methocarbamol (ROBAXIN) IV, metoCLOPramide **OR** metoCLOPramide (REGLAN) injection, ondansetron **OR** ondansetron (ZOFRAN) IV, oxyCODONE, oxyCODONE, polyethylene glycol, traZODone   Infusions:   sodium chloride Stopped (08/30/22 1441)   methocarbamol (ROBAXIN) IV 110 mL/hr at 08/30/22 1527    Antimicrobials: Anti-infectives (From admission, onward)    Start     Dose/Rate Route Frequency Ordered Stop   08/28/22 1430  ceFAZolin (ANCEF) IVPB 2g/100 mL premix        2 g 200 mL/hr over 30 Minutes Intravenous Every 6 hours 08/28/22 1418 08/29/22 0229   08/28/22 1045  ceFAZolin (ANCEF) IVPB 2g/100 mL premix        2 g 200 mL/hr over 30 Minutes Intravenous On call to O.R. 08/28/22 1032 08/28/22 1252       Nutritional status:  Body mass index is 37.86 kg/m.          Objective: Vitals:   08/30/22 2052 08/31/22 0534  BP: (!) 139/92 (!) 155/91  Pulse: 72 93  Resp: 18 18  Temp: 98.1 F (36.7 C) 98.8 F (37.1 C)  SpO2: 96% 93%    Intake/Output Summary (Last 24 hours) at 08/31/2022 0832 Last data filed at 08/31/2022 0600 Gross per 24 hour  Intake 451.7 ml  Output 600 ml  Net -148.3 ml    Filed Weights   08/28/22 1653  Weight: 93.9 kg   Weight change:  Body mass index is 37.86 kg/m.   Physical Exam: General exam: Pleasant, in slight pain after ambulating with therapy. Skin: No rashes, lesions or ulcers. HEENT: Atraumatic, normocephalic, no obvious bleeding Lungs: Clear to auscultation bilaterally CVS: Regular rate and rhythm, no murmur GI/Abd soft, nontender, nondistended, bowel sound present CNS: Alert, awake, oriented x 3 Psychiatry: Mood  appropriate Extremities: No pedal edema, no complaints  Data Review: I have personally reviewed the laboratory data and studies available.  F/u labs ordered Unresulted Labs (From admission, onward)    None       Total time spent in review of labs and imaging, patient evaluation, formulation of plan, documentation and communication with family: 45 minutes  Signed, Lorin Glass, MD [facility] 08/31/2022

## 2022-08-31 NOTE — TOC Progression Note (Signed)
Transition of Care [facility]) - Progression Note   Patient Details  Name: [name] [name] MRN: [id number] Date of Birth: 1960/09/18  Transition of Care [facility] CM/SW Contact  Ewing Schlein, LCSW Phone Number: 08/31/2022, 1:37 PM  Clinical Narrative: Patient is requesting SNF due to limited support at home. CSW followed up with ortho PA and HHPT is recommended rather than SNF. [facility] is the requested agency. CSW made [facility] referral to [facility] with [facility], which was accepted. CSW updated patient and ortho PA regarding [facility] accepting the referral.  Barriers to Discharge: No Barriers Identified  Expected Discharge Plan and Services Expected Discharge Date: 08/28/22               DME Arranged: N/A DME Agency: NA HH Arranged: PT HH Agency: [facility] Date HH Agency Contacted: 08/31/22 Time HH Agency Contacted: 1329 Representative spoke with at [facility] Agency: Tresa Endo  Social Determinants of Health (SDOH) Interventions SDOH Screenings   Food Insecurity: No Food Insecurity (08/28/2022)  Housing: Low Risk  (08/28/2022)  Transportation Needs: No Transportation Needs (08/28/2022)  Utilities: Not At Risk (08/30/2022)  Recent Concern: Utilities - At Risk (08/28/2022)  Tobacco Use: High Risk (08/29/2022)   Readmission Risk Interventions     No data to display

## 2022-08-31 NOTE — Progress Notes (Signed)
Physical Therapy Treatment Patient Details Name: [name] [name] MRN: [id number] DOB: 05-Jul-1960 Today's Date: 08/31/2022   History of Present Illness 62 yo female presents to therapy s/p R THA, anterior approach on 08/28/2022 due to failure of conservative measures. Pt PMH includes but is not limited to: asthma, chronic HA, DDD, and HDL.    PT Comments    Pt ambulated in hallway and practiced one step.  Pt returned to bed per request and ice packs applied to Rt LE.    Recommendations for follow up therapy are one component of a multi-disciplinary discharge planning process, led by the attending physician.  Recommendations may be updated based on patient status, additional functional criteria and insurance authorization.  Follow Up Recommendations       Assistance Recommended at Discharge Intermittent Supervision/Assistance  Patient can return home with the following A little help with walking and/or transfers;Assistance with cooking/housework;Assist for transportation;Help with stairs or ramp for entrance;A little help with bathing/dressing/bathroom   Equipment Recommendations  None recommended by PT    Recommendations for Other Services       Precautions / Restrictions Precautions Precautions: Fall Restrictions Other Position/Activity Restrictions: WBAT     Mobility  Bed Mobility Overal bed mobility: Needs Assistance Bed Mobility: Supine to Sit, Sit to Supine     Supine to sit: Min guard, HOB elevated Sit to supine: Min guard, HOB elevated   General bed mobility comments: Increased time and effort, utilized gait belt to self assist R LE    Transfers Overall transfer level: Needs assistance Equipment used: Rolling walker (2 wheels) Transfers: Sit to/from Stand Sit to Stand: Min guard           General transfer comment: Min guard for safety.    Ambulation/Gait Ambulation/Gait assistance: Min guard Gait Distance (Feet): 80 Feet Assistive device: Rolling  walker (2 wheels) Gait Pattern/deviations: Step-through pattern, Decreased stride length, Antalgic, Step-to pattern Gait velocity: decreased     General Gait Details: slow but steady with RW, cues for step length for pain control   Stairs Stairs: Yes Stairs assistance: Min guard Stair Management: Step to pattern, Forwards, With walker Number of Stairs: 1 General stair comments: verbal cues for sequence, safety, RW positioning; pt performed twice; no physical assist required   Wheelchair Mobility    Modified Rankin (Stroke Patients Only)       Balance                                            Cognition Arousal/Alertness: Awake/alert Behavior During Therapy: WFL for tasks assessed/performed, Flat affect Overall Cognitive Status: Within Functional Limits for tasks assessed                                          Exercises      General Comments        Pertinent Vitals/Pain Pain Assessment Pain Assessment: 0-10 Pain Score: 7  Pain Location: R hip/thigh Pain Descriptors / Indicators: Sore, Aching, Guarding, Grimacing Pain Intervention(s): Premedicated before session, Repositioned, Monitored during session, Ice applied    Home Living                          Prior Function  PT Goals (current goals can now be found in the care plan section) Progress towards PT goals: Progressing toward goals    Frequency    7X/week      PT Plan Current plan remains appropriate    Co-evaluation              AM-PAC PT "6 Clicks" Mobility   Outcome Measure  Help needed turning from your back to your side while in a flat bed without using bedrails?: A Little Help needed moving from lying on your back to sitting on the side of a flat bed without using bedrails?: A Little Help needed moving to and from a bed to a chair (including a wheelchair)?: A Little Help needed standing up from a chair using your arms  (e.g., wheelchair or bedside chair)?: A Little Help needed to walk in hospital room?: A Little Help needed climbing 3-5 steps with a railing? : A Little 6 Click Score: 18    End of Session Equipment Utilized During Treatment: Gait belt Activity Tolerance: Patient tolerated treatment well Patient left: in bed;with call bell/phone within reach Nurse Communication: Mobility status PT Visit Diagnosis: Other abnormalities of gait and mobility (R26.89);Pain Pain - Right/Left: Right Pain - part of body: Hip     Time: 0903-0921 PT Time Calculation (min) (ACUTE ONLY): 18 min  Charges:  $Gait Training: 8-22 mins                    Paulino Door, DPT Physical Therapist Acute Rehabilitation Services Office: [phone]    [name] 08/31/2022, 2:20 PM

## 2022-09-01 DIAGNOSIS — Z96641 Presence of right artificial hip joint: Secondary | ICD-10-CM | POA: Diagnosis not present

## 2022-09-01 MED ORDER — ALPRAZOLAM 0.25 MG PO TABS: 0.2500 mg | ORAL_TABLET | Freq: Two times a day (BID) | ORAL | 0 refills | Status: AC | PRN

## 2022-09-01 MED ORDER — MELATONIN 3 MG PO TABS: 3.0000 mg | ORAL_TABLET | Freq: Every evening | ORAL | 0 refills | Status: AC | PRN

## 2022-09-01 MED ORDER — CARVEDILOL 12.5 MG PO TABS: 12.5000 mg | ORAL_TABLET | Freq: Two times a day (BID) | ORAL | 2 refills | Status: AC

## 2022-09-01 MED ORDER — POTASSIUM CHLORIDE CRYS ER 20 MEQ PO TBCR: 40.0000 meq | EXTENDED_RELEASE_TABLET | Freq: Every day | ORAL | 0 refills | Status: AC

## 2022-09-01 MED ORDER — AMLODIPINE BESYLATE 5 MG PO TABS: 5.0000 mg | ORAL_TABLET | Freq: Every day | ORAL | 2 refills | Status: AC

## 2022-09-01 NOTE — TOC Transition Note (Signed)
Transition of Care [facility]) - CM/SW Discharge Note   Patient Details  Name: [name] [name] MRN: [id number] Date of Birth: 1960-07-19  Transition of [facility]) CM/SW Contact:  Adrian Prows, RN Phone Number: 09/01/2022, 10:23 AM   Clinical Narrative:    D/C orders received; HHPT arranged w/ [facility]; notified Katina at agency of pt's d/c today; no TOC needs.   Final next level of care: Home w Home Health Services Barriers to Discharge: No Barriers Identified   Patient Goals and CMS Choice      Discharge Placement                         Discharge Plan and Services Additional resources added to the After Visit Summary for                  DME Arranged: N/A DME Agency: NA       HH Arranged: PT HH Agency: [facility] Date [facility] Agency Contacted: 08/31/22 Time HH Agency Contacted: 1329 Representative spoke with at [facility] Agency: Tresa Endo  Social Determinants of Health (SDOH) Interventions SDOH Screenings   Food Insecurity: No Food Insecurity (08/28/2022)  Housing: Low Risk  (08/28/2022)  Transportation Needs: No Transportation Needs (08/28/2022)  Utilities: Not At Risk (08/30/2022)  Recent Concern: Utilities - At Risk (08/28/2022)  Tobacco Use: High Risk (08/29/2022)     Readmission Risk Interventions     No data to display

## 2022-09-01 NOTE — Progress Notes (Signed)
Physical Therapy Treatment Patient Details Name: [name] [name] MRN: [id number] DOB: 27-Dec-1960 Today's Date: 09/01/2022   History of Present Illness 62 yo female presents to therapy s/p R THA, anterior approach on 08/28/2022 due to failure of conservative measures. Pt PMH includes but is not limited to: asthma, chronic HA, DDD, and HDL.    PT Comments    Pt ambulated in hallway and performed one step again this afternoon without any difficulty.  Pt also performed LE exercises upon return to bed.  Pt anticipates HHPT for follow up.  Pt feels ready for d/c home today.   Recommendations for follow up therapy are one component of a multi-disciplinary discharge planning process, led by the attending physician.  Recommendations may be updated based on patient status, additional functional criteria and insurance authorization.  Follow Up Recommendations       Assistance Recommended at Discharge Intermittent Supervision/Assistance  Patient can return home with the following A little help with walking and/or transfers;Assistance with cooking/housework;Assist for transportation;Help with stairs or ramp for entrance;A little help with bathing/dressing/bathroom   Equipment Recommendations  None recommended by PT    Recommendations for Other Services       Precautions / Restrictions Precautions Precautions: Fall Restrictions Weight Bearing Restrictions: No RLE Weight Bearing: Weight bearing as tolerated     Mobility  Bed Mobility Overal bed mobility: Needs Assistance Bed Mobility: Supine to Sit, Sit to Supine     Supine to sit: Supervision, HOB elevated Sit to supine: Supervision, HOB elevated   General bed mobility comments: utilized gait belt to self assist R LE    Transfers Overall transfer level: Needs assistance Equipment used: Rolling walker (2 wheels) Transfers: Sit to/from Stand Sit to Stand: Supervision                Ambulation/Gait Ambulation/Gait assistance:  Supervision, Min guard Gait Distance (Feet): 90 Feet Assistive device: Rolling walker (2 wheels) Gait Pattern/deviations: Step-through pattern, Decreased stride length, Antalgic       General Gait Details: slow but steady with RW   Stairs Stairs: Yes Stairs assistance: Min guard Stair Management: Step to pattern, Forwards, With walker Number of Stairs: 1 General stair comments: pt able to recall safe positioning and sequencing; no physical assist required   Wheelchair Mobility    Modified Rankin (Stroke Patients Only)       Balance                                            Cognition Arousal/Alertness: Awake/alert Behavior During Therapy: WFL for tasks assessed/performed Overall Cognitive Status: Within Functional Limits for tasks assessed                                          Exercises Total Joint Exercises Ankle Circles/Pumps: AROM, Both, 10 reps Quad Sets: AROM, 10 reps, Right Heel Slides: AAROM, Right, 10 reps Hip ABduction/ADduction: AAROM, Right, 10 reps Long Arc Quad: AROM, Seated, Right, 10 reps    General Comments        Pertinent Vitals/Pain Pain Assessment Pain Assessment: 0-10 Pain Score: 6  Pain Location: R hip/thigh Pain Descriptors / Indicators: Sore, Aching, Guarding, Grimacing Pain Intervention(s): Repositioned, Monitored during session, Premedicated before session    Home Living  Prior Function            PT Goals (current goals can now be found in the care plan section) Progress towards PT goals: Progressing toward goals    Frequency    7X/week      PT Plan Current plan remains appropriate    Co-evaluation              AM-PAC PT "6 Clicks" Mobility   Outcome Measure  Help needed turning from your back to your side while in a flat bed without using bedrails?: A Little Help needed moving from lying on your back to sitting on the side of a  flat bed without using bedrails?: A Little Help needed moving to and from a bed to a chair (including a wheelchair)?: A Little Help needed standing up from a chair using your arms (e.g., wheelchair or bedside chair)?: A Little Help needed to walk in hospital room?: A Little Help needed climbing 3-5 steps with a railing? : A Little 6 Click Score: 18    End of Session Equipment Utilized During Treatment: Gait belt Activity Tolerance: Patient tolerated treatment well Patient left: in bed;with call bell/phone within reach Nurse Communication: Mobility status PT Visit Diagnosis: Other abnormalities of gait and mobility (R26.89)     Time: 1243-1300 PT Time Calculation (min) (ACUTE ONLY): 17 min  Charges:  $Therapeutic Exercise: 8-22 mins                    Paulino Door, DPT Physical Therapist Acute Rehabilitation Services Office: [phone]    [name] 09/01/2022, 1:29 PM

## 2022-09-01 NOTE — Progress Notes (Signed)
Subjective: Patient reports pain as moderate to severe. Worse with movement. Tolerating diet.  Urinating.  No CP, SOB, blurry vision, HA, palpitations.  Has mobilized OOB with PT/OT.   BP has improved. History of hypertensive episodes after surgeries in past.  BP likely chronically uncontrolled.  Potassium improved.  Pt expresses concern about going home again, still wants SNF.  This has been discussed several times with Dr. Greig Right team after surgery and the patient, prefer HHPT over SNF.  HHPT is set up and patient is cleared for DC from standpoint of hospitalist and Dr. Greig Right orthopedic team.   Objective:   VITALS:   Vitals:   08/30/22 2052 08/31/22 0534 08/31/22 1357 08/31/22 2228  BP: (!) 139/92 (!) 155/91 129/85 107/61  Pulse: 72 93 80 78  Resp: 18 18 18 18   Temp: 98.1 F (36.7 C) 98.8 F (37.1 C) 98.9 F (37.2 C) 98.4 F (36.9 C)  TempSrc: Oral Oral Oral Oral  SpO2: 96% 93% 90% 95%  Weight:      Height:          Latest Ref Rng & Units 08/30/2022    9:05 AM 06/20/2017    3:34 PM 03/17/2017   10:23 AM  CBC  WBC 4.0 - 10.5 K/uL 8.2  6.6  6.9   Hemoglobin 12.0 - 15.0 g/dL 16.1  09.6  04.5   Hematocrit 36.0 - 46.0 % 37.9  44.1  47.3   Platelets 150 - 400 K/uL 195  235  253       Latest Ref Rng & Units 08/31/2022    7:48 AM 08/30/2022    9:05 AM 06/20/2017    3:34 PM  BMP  Glucose 70 - 99 mg/dL 409  811  96   BUN 8 - 23 mg/dL 8  5  37   Creatinine 9.14 - 1.00 mg/dL 7.82  9.56  2.13   Sodium 135 - 145 mmol/L 134  135  137   Potassium 3.5 - 5.1 mmol/L 3.0  2.6  4.1   Chloride 98 - 111 mmol/L 94  97  98   CO2 22 - 32 mmol/L 29  30  26    Calcium 8.9 - 10.3 mg/dL 8.6  8.6  9.4    Intake/Output      06/14 0701 06/15 0700   P.O. 660   I.V. (mL/kg) 0 (0)   IV Piggyback 0   Total Intake(mL/kg) 660 (7)   Urine (mL/kg/hr) 350 (0.2)   Stool 0   Total Output 350   Net +310       Urine Occurrence 6 x   Stool Occurrence 5 x      Physical Exam: General: NAD.  Getting back in bed after doing some work with PT/OT Resp: No increased wob Cardio: RRR ABD soft Neurologically intact MSK Neurovascularly intact Sensation intact distally Intact pulses distally Dorsiflexion/Plantar flexion intact Incision: dressing C/D/I   Assessment: 4 Days Post-Op  S/P Procedure(s) (LRB): TOTAL HIP ARTHROPLASTY ANTERIOR APPROACH (Right) by Dr. Jewel Baize. Murphy on 08/28/22  Principal Problem:   S/P total right hip arthroplasty Active Problems:   Tobacco use disorder   Chronic back pain   Familial hypercholesterolemia   GAD (generalized anxiety disorder)   Class 2 obesity   Hypertensive urgency   ETOH abuse   Glucose intolerance   Vomiting and diarrhea   Plan: Hospitalist provided assistance for hypertensive urgency/crisis management, now improved.  She will follow their recommendations for BP management as they  discussed, in addition to some PO potassium therapy.  Recommend follow up with PCP for better management and potassium recheck in 1 week.  They also provided some assistance for her anxiety.  She is a chronic pain patient on daily Vicodin 10-325mg  q6h as well as Gabapentin. Continue these meds.  Keep Oxy PRN for the acute pain. Vicodin for chronic pain.   Advance diet Up with therapy Incentive Spirometry Elevate and Apply ice  Weightbearing: WBAT RLE Insicional and dressing care: Dressings left intact until follow-up and Reinforce dressings as needed Orthopedic device(s): None Showering: Keep dressing dry VTE prophylaxis: Aspirin 81mg  BID  x 30 days postop , SCDs, ambulation Pain control: PRN Follow - up plan: 2 weeks Contact information:  Margarita Rana MD, Levester Fresh PA-C  Dispo: Home once pain controlled, passes PT, and medically cleared by hospitalist team.    Cecil Cobbs, PA-C Office [phone] 09/01/2022, 6:25 AM

## 2022-09-01 NOTE — Progress Notes (Signed)
Physical Therapy Treatment Patient Details Name: [name] [name] MRN: [id number] DOB: 10/22/1960 Today's Date: 09/01/2022   History of Present Illness 62 yo female presents to therapy s/p R THA, anterior approach on 08/28/2022 due to failure of conservative measures. Pt PMH includes but is not limited to: asthma, chronic HA, DDD, and HDL.    PT Comments    Pt in good spirits this morning.  Pt ambulated in hallway and practiced step again.  Pt mobilizing well.     Recommendations for follow up therapy are one component of a multi-disciplinary discharge planning process, led by the attending physician.  Recommendations may be updated based on patient status, additional functional criteria and insurance authorization.  Follow Up Recommendations       Assistance Recommended at Discharge Intermittent Supervision/Assistance  Patient can return home with the following A little help with walking and/or transfers;Assistance with cooking/housework;Assist for transportation;Help with stairs or ramp for entrance;A little help with bathing/dressing/bathroom   Equipment Recommendations  None recommended by PT    Recommendations for Other Services       Precautions / Restrictions Precautions Precautions: Fall Restrictions Weight Bearing Restrictions: No RLE Weight Bearing: Weight bearing as tolerated     Mobility  Bed Mobility Overal bed mobility: Needs Assistance Bed Mobility: Supine to Sit, Sit to Supine     Supine to sit: Supervision, HOB elevated Sit to supine: Supervision, HOB elevated   General bed mobility comments: utilized gait belt to self assist R LE    Transfers Overall transfer level: Needs assistance Equipment used: Rolling walker (2 wheels) Transfers: Sit to/from Stand Sit to Stand: Supervision                Ambulation/Gait Ambulation/Gait assistance: Min guard, Supervision Gait Distance (Feet): 70 Feet Assistive device: Rolling walker (2 wheels) Gait  Pattern/deviations: Step-through pattern, Decreased stride length, Antalgic       General Gait Details: slow but steady with RW   Stairs Stairs: Yes Stairs assistance: Min guard Stair Management: Step to pattern, Forwards, With walker, Backwards Number of Stairs: 1 General stair comments: verbal cues for sequence, safety, RW positioning; pt performed twice; no physical assist required   Wheelchair Mobility    Modified Rankin (Stroke Patients Only)       Balance                                            Cognition Arousal/Alertness: Awake/alert Behavior During Therapy: WFL for tasks assessed/performed Overall Cognitive Status: Within Functional Limits for tasks assessed                                          Exercises      General Comments        Pertinent Vitals/Pain Pain Assessment Pain Assessment: 0-10 Pain Score: 6  Pain Location: R hip/thigh Pain Descriptors / Indicators: Sore, Aching, Guarding, Grimacing Pain Intervention(s): Premedicated before session, Monitored during session, Repositioned    Home Living                          Prior Function            PT Goals (current goals can now be found in the care plan section) Progress towards  PT goals: Progressing toward goals    Frequency    7X/week      PT Plan Current plan remains appropriate    Co-evaluation              AM-PAC PT "6 Clicks" Mobility   Outcome Measure  Help needed turning from your back to your side while in a flat bed without using bedrails?: A Little Help needed moving from lying on your back to sitting on the side of a flat bed without using bedrails?: A Little Help needed moving to and from a bed to a chair (including a wheelchair)?: A Little Help needed standing up from a chair using your arms (e.g., wheelchair or bedside chair)?: A Little Help needed to walk in hospital room?: A Little Help needed climbing  3-5 steps with a railing? : A Little 6 Click Score: 18    End of Session Equipment Utilized During Treatment: Gait belt Activity Tolerance: Patient tolerated treatment well Patient left: in bed;with call bell/phone within reach Nurse Communication: Mobility status PT Visit Diagnosis: Other abnormalities of gait and mobility (R26.89)     Time: 1610-9604 PT Time Calculation (min) (ACUTE ONLY): 12 min  Charges:  $Gait Training: 8-22 mins                     Paulino Door, DPT Physical Therapist Acute Rehabilitation Services Office: [phone]    Kati L Payson 09/01/2022, 11:21 AM

## 2022-09-01 NOTE — Plan of Care (Signed)
  Problem: Activity: Goal: Ability to avoid complications of mobility impairment will improve Outcome: Progressing Goal: Ability to tolerate increased activity will improve Outcome: Progressing   Problem: Clinical Measurements: Goal: Diagnostic test results will improve Outcome: Progressing   Problem: Elimination: Goal: Will not experience complications related to bowel motility Outcome: Progressing

## 2022-09-01 NOTE — Progress Notes (Signed)
PROGRESS NOTE  [name] [name]  DOB: 08-Nov-1960  PCP: Lauretta Grill, FNP ZOX:[id number]  DOA: 08/28/2022  LOS: 3 days  Hospital Day: 5  Brief narrative: [name] [name] is a 62 y.o. female with PMH significant for HTN, HLD, asthma, chronic headache, degenerative disc disease, anxiety/depression. 6/11, patient underwent elective right TKA for osteoarthritis.  She was monitored overnight.  She was noted to have significantly elevated blood pressure as high as 195/122. 6/12, hospitalist service was consulted for uncontrolled hypertension.  Subjective: Patient was seen and examined this morning.  Lying on bed.  Not in distress.  Blood pressure improved.  Orthopedics preparing for discharge today.  Assessment and plan: Hypertensive urgency Patient has history of hypertension and was on Coreg 6.25 mg twice daily at home.   6/11, postop, she had significant rise in blood pressure over 190 systolic.  As needed IV labetalol and IV hydralazine were tried. Patient reports that she had similar rise in blood pressure postop after her back surgery in the past.  Her cardiologist then increased carvedilol to 6.25 mg twice daily.  In the past she was also on lisinopril but she was very sensitive to it, her blood pressure dropped making her elevated hence it was stopped. Patient does not monitor her blood pressure at home.  There is a fair chance that her blood pressure is elevated at baseline and might have worsened perioperatively. Blood pressure medicine adjusted.  Carvedilol was increased to 12.5 mg twice daily.  Amlodipine 5 mg daily was added.  Blood pressure gradually improving on this regimen.  At discharge, I would continue this regimen. Monitor blood pressure at home. Follow-up PCP as an outpatient for further assessment.  Chronic alcohol use Patient reports drinking 2 ounces of liquor nightly before bed.  Last drink was on the night of 6/10.  No withdrawal symptoms noted in the  hospital. Counseled to quit alcohol.  Hypokalemia Potassium level low.  Trend as below.  Replacement given. Recent Labs  Lab 08/30/22 0905 08/31/22 0748  K 2.6* 3.0*  MG 1.7  --   PHOS 3.1  --    Familial hypercholesterolemia Continue atorvastatin 40 mg p.o. daily.  Osteoarthritis S/P right TKA  per orthopedic surgery PT eval obtained.  Outpatient PT recommended.   Chronic back pain Analgesics and muscle relaxants as needed.  Tobacco use disorder Smokes 1 pack/day.  Declined nicotine replacement therapy at this moment.   GAD (generalized anxiety disorder) Trazodone as needed for insomnia while in the hospital. It seems that patient's uncontrolled anxiety is the cause of her dependence to smoking and alcohol. She has been started on Xanax 0.25 mg PRN twice daily.  7-day supply given at discharge.  Morbid Obesity  Body mass index is 37.86 kg/m. Patient has been advised to make an attempt to improve diet and exercise patterns to aid in weight loss.  Constipation Bowel regimen to continue  Goals of care   Code Status: Full Code     DVT prophylaxis:  SCDs Start: 08/28/22 1653 Place TED hose Start: 08/28/22 1653   Antimicrobials: None Fluid: None Family Communication: None at bedside  Status: Inpatient Level of care:  Med-Surg   Patient from: Home Anticipated d/c to: Home with home health.  Okay to discharge today from medicine standpoint   Diet:  Diet Order             Diet regular Fluid consistency: Thin  Diet effective now  Diet - low sodium heart healthy                   Scheduled Meds:  ALPRAZolam  0.25 mg Oral BID   amLODipine  5 mg Oral Daily   aspirin  81 mg Oral BID   atorvastatin  10 mg Oral Daily   carvedilol  12.5 mg Oral BID WC   dicyclomine  10 mg Oral TID AC & HS   docusate sodium  100 mg Oral BID   gabapentin  300 mg Oral TID   HYDROcodone-acetaminophen  1 tablet Oral Q6H   pantoprazole  40 mg Oral Daily    PRN  meds: sodium chloride, acetaminophen, albuterol, alum & mag hydroxide-simeth, bisacodyl, diphenhydrAMINE, hydrALAZINE, HYDROmorphone (DILAUDID) injection, menthol-cetylpyridinium **OR** phenol, methocarbamol **OR** methocarbamol (ROBAXIN) IV, metoCLOPramide **OR** metoCLOPramide (REGLAN) injection, ondansetron **OR** ondansetron (ZOFRAN) IV, oxyCODONE, oxyCODONE, polyethylene glycol, traZODone   Infusions:   sodium chloride Stopped (08/30/22 1441)   methocarbamol (ROBAXIN) IV 110 mL/hr at 09/01/22 0756    Antimicrobials: Anti-infectives (From admission, onward)    Start     Dose/Rate Route Frequency Ordered Stop   08/28/22 1430  ceFAZolin (ANCEF) IVPB 2g/100 mL premix        2 g 200 mL/hr over 30 Minutes Intravenous Every 6 hours 08/28/22 1418 08/29/22 0229   08/28/22 1045  ceFAZolin (ANCEF) IVPB 2g/100 mL premix        2 g 200 mL/hr over 30 Minutes Intravenous On call to O.R. 08/28/22 1032 08/28/22 1252       Nutritional status:  Body mass index is 37.86 kg/m.          Objective: Vitals:   09/01/22 0751 09/01/22 0914  BP: (!) 158/88 (!) 142/90  Pulse: 92 88  Resp: 18   Temp: 98.7 F (37.1 C)   SpO2: 94%     Intake/Output Summary (Last 24 hours) at 09/01/2022 1026 Last data filed at 09/01/2022 0756 Gross per 24 hour  Intake 660 ml  Output 150 ml  Net 510 ml   Filed Weights   08/28/22 1653  Weight: 93.9 kg   Weight change:  Body mass index is 37.86 kg/m.   Physical Exam: General exam: Pleasant, in slight pain after ambulating with therapy. Skin: No rashes, lesions or ulcers. HEENT: Atraumatic, normocephalic, no obvious bleeding Lungs: Clear to auscultation bilaterally CVS: Regular rate and rhythm, no murmur GI/Abd soft, nontender, nondistended, bowel sound present CNS: Alert, awake, oriented x 3 Psychiatry: Mood appropriate Extremities: No pedal edema, no complaints  Data Review: I have personally reviewed the laboratory data and studies  available.  F/u labs ordered Unresulted Labs (From admission, onward)    None       Total time spent in review of labs and imaging, patient evaluation, formulation of plan, documentation and communication with family: 45 minutes  Signed, Lorin Glass, MD [facility] 09/01/2022

## 2022-09-01 NOTE — Progress Notes (Signed)
Provided discharge education/instructions, all questions and concerns addressed. Pt not in any distress, discharged home with all of her belongings. 

## 2022-10-09 ENCOUNTER — Ambulatory Visit: Payer: Medicare Other | Admitting: Gastroenterology

## 2022-10-09 ENCOUNTER — Encounter: Payer: Self-pay | Admitting: Gastroenterology

## 2022-10-09 NOTE — Progress Notes (Deleted)
GI Office Note    Referring Provider: Roe Rutherford, NP Primary Care Physician:  Roe Rutherford, NP  Primary Gastroenterologist:  Chief Complaint   No chief complaint on file.    History of Present Illness   [name] [name] is a 62 y.o. female presenting today at the request of Roe Rutherford, NP for chronic diarrhea.    Labs 09/18/22: Glu 105, Cre 0.65, Na 139, Tbili 0.3, AP 151H, AST 52H, ALT 24, Alb 3.7   EGD 10/2011: -mild reactive changes, no H.pylori    Etoh abuse GAD GERD IBS-D    Medications   Current Outpatient Medications  Medication Sig Dispense Refill   acetaminophen (TYLENOL) 500 MG tablet Take 2 tablets (1,000 mg total) by mouth every 6 (six) hours as needed for mild pain or moderate pain. 60 tablet 0   albuterol (VENTOLIN HFA) 108 (90 Base) MCG/ACT inhaler Inhale 1 puff into the lungs every 4 (four) hours as needed for wheezing.     amLODipine (NORVASC) 5 MG tablet Take 1 tablet (5 mg total) by mouth daily. 30 tablet 2   aspirin EC 81 MG tablet Take 1 tablet (81 mg total) by mouth 2 (two) times daily. To prevent blood clots for 30 days after surgery. 60 tablet 0   atorvastatin (LIPITOR) 40 MG tablet Take 10 mg by mouth daily.  1   carvedilol (COREG) 12.5 MG tablet Take 1 tablet (12.5 mg total) by mouth 2 (two) times daily with a meal. 60 tablet 2   dicyclomine (BENTYL) 10 MG capsule Take 10 mg by mouth 4 (four) times daily -  before meals and at bedtime.     estradiol (CLIMARA - DOSED IN MG/24 HR) 0.075 mg/24hr patch PLACE 1 PATCH (0.075 MG TOTAL) ONTO THE SKIN ONCE A WEEK. (Patient not taking: Reported on 08/15/2022) 4 patch 1   meloxicam (MOBIC) 15 MG tablet Take 1 tablet (15 mg total) by mouth daily as needed for pain (and inflammation). 30 tablet 0   methocarbamol (ROBAXIN) 500 MG tablet Take 500 mg by mouth daily.     ondansetron (ZOFRAN ODT) 4 MG disintegrating tablet Take 1 tablet (4 mg total) by mouth every 8 (eight) hours as needed for  nausea or vomiting. (Patient not taking: Reported on 08/15/2022) 20 tablet 0   ondansetron (ZOFRAN-ODT) 4 MG disintegrating tablet Take 1 tablet (4 mg total) by mouth every 8 (eight) hours as needed for nausea or vomiting. 15 tablet 0   oxyCODONE (ROXICODONE) 5 MG immediate release tablet Take 1 tablet (5 mg total) by mouth every 4 (four) hours as needed for severe pain. 30 tablet 0   potassium chloride SA (KLOR-CON M) 20 MEQ tablet Take 2 tablets (40 mEq total) by mouth daily for 3 days. 6 tablet 0   ranitidine (ZANTAC) 150 MG tablet Take 1 tablet (150 mg total) by mouth 2 (two) times daily. (Patient not taking: Reported on 08/15/2022) 60 tablet 0   tiZANidine (ZANAFLEX) 4 MG capsule Take 6 mg by mouth 3 (three) times daily as needed for muscle spasms.      No current facility-administered medications for this visit.    Allergies   Allergies as of 10/09/2022 - Review Complete 08/28/2022  Allergen Reaction Noted   Hydromorphone Hives 06/25/2017   Lisinopril Nausea And Vomiting 09/29/2018   Morphine Hives 06/25/2017   Sulfonamide derivatives Hives 11/14/2009   Prednisone Hives and Other (See Comments) 05/03/2011    Past Medical History   Past Medical  History:  Diagnosis Date   ALLERGIC RHINITIS    Anxiety    Asthma    Chronic headaches    Complication of anesthesia    DDD (degenerative disc disease)    Depression    Dyspnea    Hyperlipidemia    Hypertension    PONV (postoperative nausea and vomiting)     Past Surgical History   Past Surgical History:  Procedure Laterality Date   CESAREAN SECTION     x3   CHOLECYSTECTOMY  08/17/2009   KNEE ARTHROSCOPY Right    neck/back surgery  1998; 2012   NOVASURE ABLATION N/A 07/28/2012   Procedure: D&C NOVASURE ABLATION;  Surgeon: Genia Del, MD;  Location: [facility];  Service: Gynecology;  Laterality: N/A;   SHOULDER ARTHROTOMY Right    TENDON RELEASE Right    thumb   TONSILLECTOMY     TOTAL HIP ARTHROPLASTY Right 08/28/2022    Procedure: TOTAL HIP ARTHROPLASTY ANTERIOR APPROACH;  Surgeon: Sheral Apley, MD;  Location: [facility];  Service: Orthopedics;  Laterality: Right;    Past Family History   Family History  Problem Relation Age of Onset   Heart disease Father        non smoker   Heart disease Paternal Grandfather        unknown if smoker-deceased when pt was 62 yrs old    Past Social History   Social History   Socioeconomic History   Marital status: Single    Spouse name: Not on file   Number of children: Not on file   Years of education: Not on file   Highest education level: Not on file  Occupational History   Occupation: IT trainer  Tobacco Use   Smoking status: Every Day    Current packs/day: 1.00    Average packs/day: 1 pack/day for 18.0 years (18.0 ttl pk-yrs)    Types: Cigarettes   Smokeless tobacco: Never  Vaping Use   Vaping status: Never Used  Substance and Sexual Activity   Alcohol use: Yes    Alcohol/week: 2.0 standard drinks of alcohol    Types: 2 Shots of liquor per week    Comment: daily   Drug use: No   Sexual activity: Not on file  Other Topics Concern   Not on file  Social History Narrative   Not on file   Social Determinants of Health   Financial Resource Strain: High Risk (07/25/2022)   Received from [facility], [facility]   Overall Financial Resource Strain (CARDIA)    Difficulty of Paying Living Expenses: Very hard  Food Insecurity: No Food Insecurity (08/28/2022)   Hunger Vital Sign    Worried About Running Out of Food in the Last Year: Never true    Ran Out of Food in the Last Year: Never true  Recent Concern: Food Insecurity - Food Insecurity Present (07/25/2022)   Received from [facility], [facility]   Hunger Vital Sign    Worried About Running Out of Food in the Last Year: Sometimes true    Ran Out of Food in the Last Year: Sometimes true  Transportation Needs: No Transportation Needs (08/28/2022)   PRAPARE - Scientist, research (physical sciences) (Medical): No    Lack of Transportation (Non-Medical): No  Recent Concern: Transportation Needs - Unmet Transportation Needs (07/25/2022)   Received from [facility], [facility]   [facility] - Transportation    Lack of Transportation (Medical): Yes    Lack of Transportation (Non-Medical): Yes  Physical Activity: Unknown (07/25/2022)   Received from [facility], [facility]   Exercise Vital Sign    Days of Exercise per Week: 0 days    Minutes of Exercise per Session: Not on file  Stress: Stress Concern Present (07/25/2022)   Received from [facility], [facility] of Occupational [facility] - Occupational Stress Questionnaire    Feeling of Stress : Rather much  Social Connections: Somewhat Isolated (07/25/2022)   Received from [facility], [facility]   Social Network    How would you rate your social network (family, work, friends)?: Restricted participation with some degree of social isolation  Intimate Partner Violence: Not At Risk (08/28/2022)   Humiliation, Afraid, Rape, and Kick questionnaire    Fear of Current or Ex-Partner: No    Emotionally Abused: No    Physically Abused: No    Sexually Abused: No    Review of Systems   General: Negative for anorexia, weight loss, fever, chills, fatigue, weakness. Eyes: Negative for vision changes.  ENT: Negative for hoarseness, difficulty swallowing , nasal congestion. CV: Negative for chest pain, angina, palpitations, dyspnea on exertion, peripheral edema.  Respiratory: Negative for dyspnea at rest, dyspnea on exertion, cough, sputum, wheezing.  GI: See history of present illness. GU:  Negative for dysuria, hematuria, urinary incontinence, urinary frequency, nocturnal urination.  MS: Negative for joint pain, low back pain.  Derm: Negative for rash or itching.  Neuro: Negative for weakness, abnormal sensation, seizure, frequent headaches, memory loss,  confusion.  Psych: Negative for anxiety,  depression, suicidal ideation, hallucinations.  Endo: Negative for unusual weight change.  Heme: Negative for bruising or bleeding. Allergy: Negative for rash or hives.  Physical Exam   LMP 10/23/2011    General: Well-nourished, well-developed in no acute distress.  Head: Normocephalic, atraumatic.   Eyes: Conjunctiva pink, no icterus. Mouth: Oropharyngeal mucosa moist and pink , no lesions erythema or exudate. Neck: Supple without thyromegaly, masses, or lymphadenopathy.  Lungs: Clear to auscultation bilaterally.  Heart: Regular rate and rhythm, no murmurs rubs or gallops.  Abdomen: Bowel sounds are normal, nontender, nondistended, no hepatosplenomegaly or masses,  no abdominal bruits or hernia, no rebound or guarding.   Rectal: *** Extremities: No lower extremity edema. No clubbing or deformities.  Neuro: Alert and oriented x 4 , grossly normal neurologically.  Skin: Warm and dry, no rash or jaundice.   Psych: Alert and cooperative, normal mood and affect.  Labs   Lab Results  Component Value Date   NA 134 (L) 08/31/2022   CL 94 (L) 08/31/2022   K 3.0 (L) 08/31/2022   CO2 29 08/31/2022   BUN 8 08/31/2022   CREATININE 0.77 08/31/2022   GFRNONAA >60 08/31/2022   CALCIUM 8.6 (L) 08/31/2022   PHOS 3.1 08/30/2022   ALBUMIN 3.0 (L) 08/30/2022   GLUCOSE 116 (H) 08/31/2022   Lab Results  Component Value Date   ALT 36 08/30/2022   AST 99 (H) 08/30/2022   ALKPHOS 84 08/30/2022   BILITOT 1.0 08/30/2022   Lab Results  Component Value Date   WBC 8.2 08/30/2022   HGB 13.0 08/30/2022   HCT 37.9 08/30/2022   MCV 110.8 (H) 08/30/2022   PLT 195 08/30/2022    Imaging Studies   No results found.  Assessment       PLAN   ***   Leanna Battles. Melvyn Neth, MHS, PA-C [facility]

## 2022-12-16 IMAGING — CT CT L SPINE W/ CM
1 of 7 series · 6 of 14 positions shown, 8 images · non-contrast
Comparison: Lumbar spine x-rays dated June 29, 2021. CT lumbar
myelogram dated January 05, 2021.

CLINICAL DATA: Chronic low back and right leg pain. New onset bowel
and bladder incontinence after L1-L2 fusion 6 weeks ago.
TECHNIQUE: Contiguous axial images were obtained through the lumbar spine after
the intrathecal infusion of contrast. Coronal and sagittal
reconstructions were obtained of the axial image sets.

[Series 3: l spine soft · axial · 0.36mm/px · z∈[-895,-719]mm · 6 of 124 slices shown, 8 images]
[im 18/124  soft-tissue]
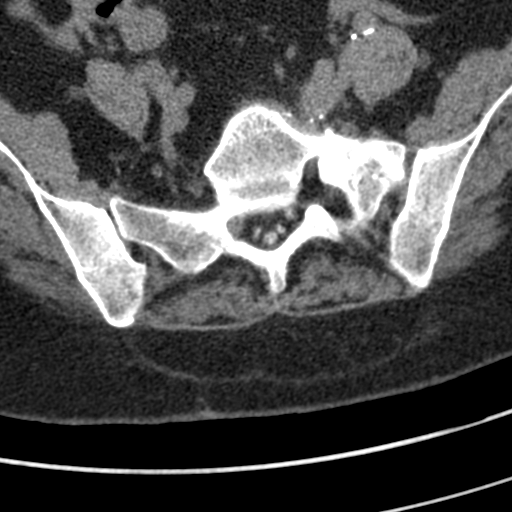
[im 18/124  bone]
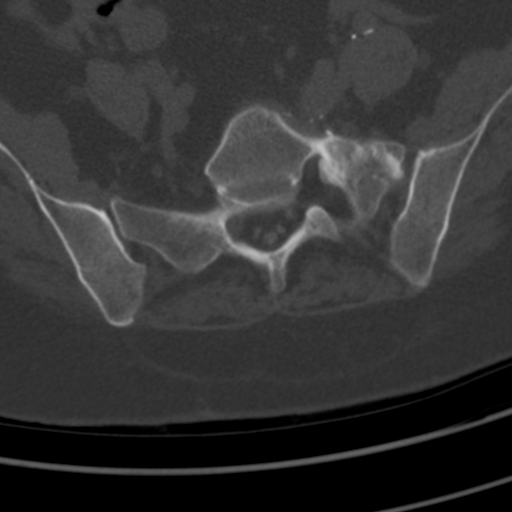
[im 36/124  bone]
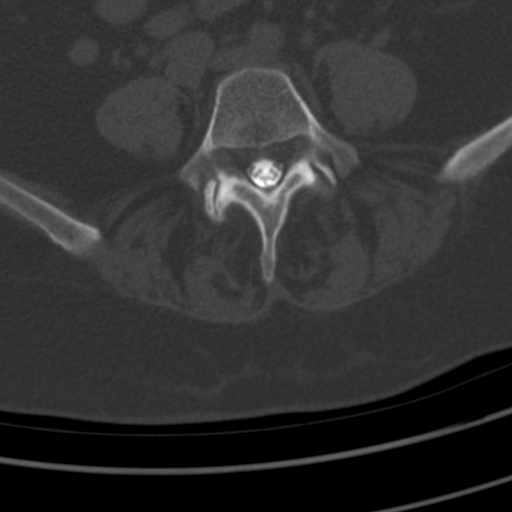
[im 53/124  bone]
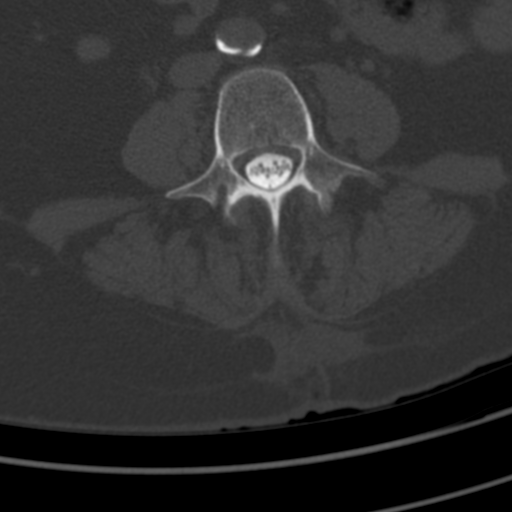
[im 71/124  bone]
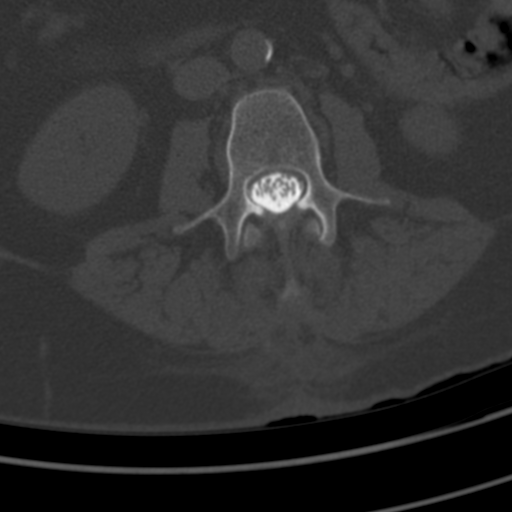
[im 88/124  soft-tissue]
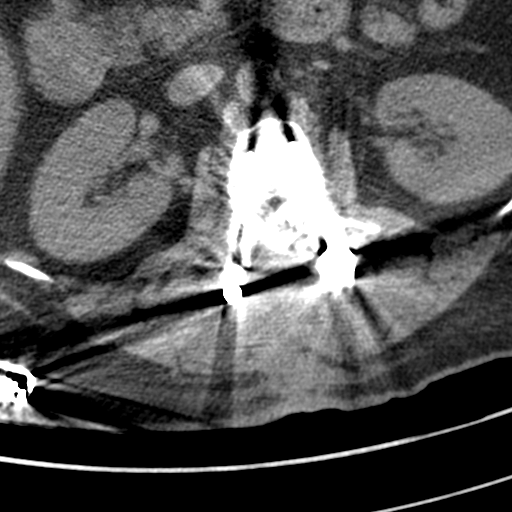
[im 88/124  bone]
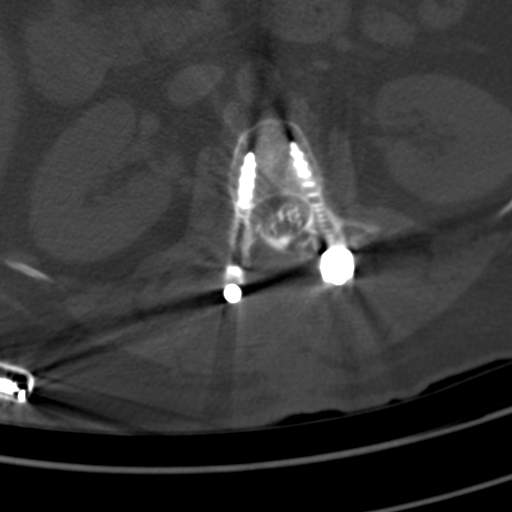
[im 106/124  bone]
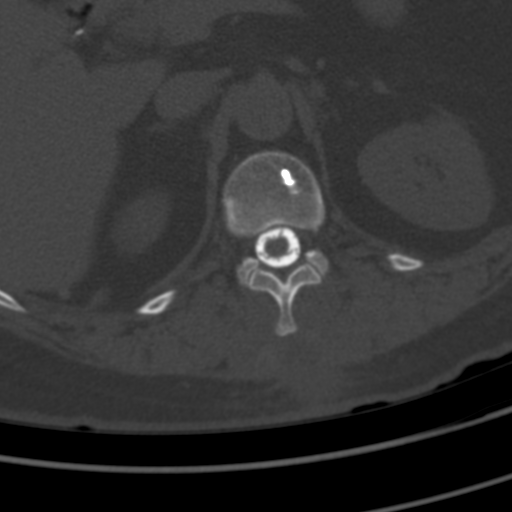

[6 of 14 positions shown; findings below may reference images not displayed]

EXAM:
LUMBAR MYELOGRAM

CT LUMBAR MYELOGRAM

FLUOROSCOPY:
Radiation Exposure Index (as provided by the fluoroscopic device):
38.8 mGy Kerma

PROCEDURE:
After thorough discussion of risks and benefits of the procedure
including bleeding, infection, injury to nerves, blood vessels,
adjacent structures as well as headache and CSF leak, written and
oral informed consent was obtained. Consent was obtained by Dr.
Prajwal Delapaz. Time out form was completed.

Patient was positioned prone on the fluoroscopy table. Local
anesthesia was provided with 1% lidocaine without epinephrine after
prepped and draped in the usual sterile fashion. Puncture was
performed at L3-L4 using a 3 1/2 inch 22-gauge spinal needle via
left interlaminar approach. Using a single pass through the dura,
the needle was placed within the thecal sac, with return of clear
CSF. 15 mL of Isovue [id number] was injected into the thecal sac, with
normal opacification of the nerve roots and cauda equina consistent
with free flow within the subarachnoid space.

I personally performed the lumbar puncture and administered the
intrathecal contrast. I also personally supervised acquisition of
the myelogram images.
FINDINGS: LUMBAR MYELOGRAM FINDINGS:

Interval L1-L2 TLIF. Normal alignment. No dynamic instability.
Unchanged small ventral extradural defects at L1-L2, L2-L3, and
L4-L5. No significant spinal canal stenosis. New underfilling of the
right L2 nerve root. Thoracic spinal cord stimulator noted.

CT LUMBAR MYELOGRAM FINDINGS:

Segmentation: Transitional lumbosacral anatomy again noted with
partial lumbarization of S1.

Alignment: Normal.

Vertebrae: No acute fracture or other focal pathologic process.

Conus medullaris and cauda equina: Conus extends to the L2 level.
Conus and cauda equina appear normal.

Paraspinal and other soft tissues: Aortoiliac atherosclerotic
vascular disease. Prior cholecystectomy.

Disc levels:

T12-L1:  Negative.

L1-L2: Interval posterior decompression and TLIF. The interbody
graft is slightly proud to the posterior endplate margin by 2-3 mm
(series 8, image 39). There is a new large right subarticular disc
extrusion with severe stenosis of the right lateral recess (series
9, image 33; series 4, image 35). The extrusion also contacts and
slightly displaces the distal spinal cord. No spinal canal or
neuroforaminal stenosis.

L2-L3:  Negative.

L3-L4: Negative disc. Unchanged mild bilateral facet arthropathy. No
stenosis.

L4-L5: Unchanged mild disc bulging and bilateral facet arthropathy.
No stenosis.

L5-S1: Unchanged mild disc bulging and bilateral facet arthropathy.
No stenosis.

S1-S2: Transitional level. Similar left S1 transverse process
pseudoarthrosis with the sacrum. Associated degenerative bony
hypertrophy results in mild narrowing of the ventral left S1 neural
foramen, unchanged.
IMPRESSION: 1. Interval L1-L2 TLIF with new large right subarticular disc
extrusion causing severe stenosis of the right lateral recess and
impingement of the descending right L2 nerve root. The extrusion
also contacts and mildly displaces the distal spinal cord.
2. The interbody graft is slightly proud to the posterior endplate
margin by 2-3 mm.
3. Unchanged mild lower lumbar spondylosis without stenosis.
4. Aortic Atherosclerosis ([id number]).
